# Patient Record
Sex: Male | Born: 1945 | Race: White | Hispanic: No | Smoking: Former smoker
Health system: Southern US, Community
[De-identification: ages and names within clinical notes are randomized; demographics above are authoritative.]

## PROBLEM LIST (undated history)

## (undated) DIAGNOSIS — I700 Atherosclerosis of aorta: Secondary | ICD-10-CM

## (undated) DIAGNOSIS — E119 Type 2 diabetes mellitus without complications: Secondary | ICD-10-CM

## (undated) DIAGNOSIS — Z8679 Personal history of other diseases of the circulatory system: Secondary | ICD-10-CM

## (undated) DIAGNOSIS — R51 Headache: Secondary | ICD-10-CM

## (undated) DIAGNOSIS — K429 Umbilical hernia without obstruction or gangrene: Secondary | ICD-10-CM

## (undated) DIAGNOSIS — R519 Headache, unspecified: Secondary | ICD-10-CM

## (undated) DIAGNOSIS — M179 Osteoarthritis of knee, unspecified: Secondary | ICD-10-CM

## (undated) DIAGNOSIS — E785 Hyperlipidemia, unspecified: Secondary | ICD-10-CM

## (undated) DIAGNOSIS — Z860100 Personal history of colon polyps, unspecified: Secondary | ICD-10-CM

## (undated) DIAGNOSIS — I713 Abdominal aortic aneurysm, ruptured, unspecified: Secondary | ICD-10-CM

## (undated) DIAGNOSIS — K5790 Diverticulosis of intestine, part unspecified, without perforation or abscess without bleeding: Secondary | ICD-10-CM

## (undated) DIAGNOSIS — M25511 Pain in right shoulder: Secondary | ICD-10-CM

## (undated) DIAGNOSIS — I10 Essential (primary) hypertension: Secondary | ICD-10-CM

## (undated) DIAGNOSIS — M1990 Unspecified osteoarthritis, unspecified site: Secondary | ICD-10-CM

## (undated) DIAGNOSIS — N433 Hydrocele, unspecified: Secondary | ICD-10-CM

## (undated) DIAGNOSIS — J449 Chronic obstructive pulmonary disease, unspecified: Secondary | ICD-10-CM

## (undated) HISTORY — PX: TONSILLECTOMY: SUR1361

## (undated) HISTORY — DX: Osteoarthritis of knee, unspecified: M17.9

## (undated) HISTORY — DX: Essential (primary) hypertension: I10

## (undated) HISTORY — PX: EYE SURGERY: SHX253

## (undated) HISTORY — PX: FINGER SURGERY: SHX640

## (undated) HISTORY — PX: ABDOMINAL AORTIC ANEURYSM REPAIR: SUR1152

## (undated) HISTORY — DX: Hyperlipidemia, unspecified: E78.5

---

## 1993-12-11 HISTORY — PX: UPPER GI ENDOSCOPY: SHX6162

## 2010-04-07 ENCOUNTER — Emergency Department (HOSPITAL_COMMUNITY): Admission: EM | Admit: 2010-04-07 | Discharge: 2010-04-07 | Payer: Self-pay | Admitting: Emergency Medicine

## 2010-05-12 ENCOUNTER — Emergency Department (HOSPITAL_COMMUNITY): Admission: EM | Admit: 2010-05-12 | Discharge: 2010-05-13 | Payer: Self-pay | Admitting: Emergency Medicine

## 2011-01-01 DIAGNOSIS — Z8679 Personal history of other diseases of the circulatory system: Secondary | ICD-10-CM

## 2011-01-01 HISTORY — DX: Personal history of other diseases of the circulatory system: Z86.79

## 2011-02-27 LAB — CBC
Platelets: 128 10*3/uL — ABNORMAL LOW (ref 150–400)
WBC: 9.2 10*3/uL (ref 4.0–10.5)

## 2011-02-27 LAB — POCT I-STAT, CHEM 8
BUN: 13 mg/dL (ref 6–23)
Creatinine, Ser: 0.8 mg/dL (ref 0.4–1.5)
Glucose, Bld: 121 mg/dL — ABNORMAL HIGH (ref 70–99)
HCT: 42 % (ref 39.0–52.0)
Potassium: 3.9 mEq/L (ref 3.5–5.1)
Sodium: 139 mEq/L (ref 135–145)
TCO2: 29 mmol/L (ref 0–100)

## 2011-02-27 LAB — POCT CARDIAC MARKERS
CKMB, poc: <1 ng/mL — ABNORMAL LOW (ref 1.0–8.0)
Troponin i, poc: <0.05 ng/mL (ref 0.00–0.09)

## 2011-02-27 LAB — D-DIMER, QUANTITATIVE: D-Dimer, Quant: 2.25 ug/mL-FEU — ABNORMAL HIGH (ref 0.00–0.48)

## 2011-02-28 LAB — COMPREHENSIVE METABOLIC PANEL
ALT: 10 U/L (ref 0–53)
AST: 20 U/L (ref 0–37)
Albumin: 4.1 g/dL (ref 3.5–5.2)
Alkaline Phosphatase: 68 U/L (ref 39–117)
BUN: 10 mg/dL (ref 6–23)
CO2: 24 mEq/L (ref 19–32)
Calcium: 8.8 mg/dL (ref 8.4–10.5)
Chloride: 102 mEq/L (ref 96–112)
Creatinine, Ser: 1.01 mg/dL (ref 0.4–1.5)
GFR calc Af Amer: >60 mL/min (ref >60)
GFR calc non Af Amer: >60 mL/min (ref >60)
Glucose, Bld: 166 mg/dL — ABNORMAL HIGH (ref 70–99)
Potassium: 3.2 mEq/L — ABNORMAL LOW (ref 3.5–5.1)
Sodium: 138 mEq/L (ref 135–145)
Total Bilirubin: 0.9 mg/dL (ref 0.3–1.2)
Total Protein: 6.6 g/dL (ref 6.0–8.3)

## 2011-02-28 LAB — POCT I-STAT, CHEM 8
BUN: 10 mg/dL (ref 6–23)
Calcium, Ion: 1.02 mmol/L — ABNORMAL LOW (ref 1.12–1.32)
Creatinine, Ser: 0.9 mg/dL (ref 0.4–1.5)
HCT: 39 % (ref 39.0–52.0)
Hemoglobin: 13.3 g/dL (ref 13.0–17.0)
Sodium: 139 mEq/L (ref 135–145)

## 2011-02-28 LAB — TYPE AND SCREEN

## 2011-02-28 LAB — CBC
Hemoglobin: 12.3 g/dL — ABNORMAL LOW (ref 13.0–17.0)
MCV: 94.8 fL (ref 78.0–100.0)
Platelets: 214 10*3/uL (ref 150–400)
RDW: 14.5 % (ref 11.5–15.5)
WBC: 20.2 10*3/uL — ABNORMAL HIGH (ref 4.0–10.5)

## 2011-02-28 LAB — POCT CARDIAC MARKERS: Troponin i, poc: <0.05 ng/mL (ref 0.00–0.09)

## 2011-02-28 LAB — DIFFERENTIAL
Eosinophils Absolute: 0 10*3/uL (ref 0.0–0.7)
Lymphocytes Relative: 8 % — ABNORMAL LOW (ref 12–46)
Lymphs Abs: 1.5 10*3/uL (ref 0.7–4.0)
Neutro Abs: 17.9 10*3/uL — ABNORMAL HIGH (ref 1.7–7.7)
Neutrophils Relative %: 89 % — ABNORMAL HIGH (ref 43–77)

## 2011-02-28 LAB — PROTIME-INR: Prothrombin Time: 13.7 seconds (ref 11.6–15.2)

## 2011-02-28 LAB — LIPASE, BLOOD: Lipase: 30 U/L (ref 11–59)

## 2012-01-30 DIAGNOSIS — D485 Neoplasm of uncertain behavior of skin: Secondary | ICD-10-CM | POA: Diagnosis not present

## 2012-01-30 DIAGNOSIS — L708 Other acne: Secondary | ICD-10-CM | POA: Diagnosis not present

## 2012-04-05 IMAGING — CT CT ABD-PELV W/O CM
2 of 4 series · 17 of 46 positions shown, 19 images · non-contrast
Comparison: None.

CLINICAL DATA: Severe left flank pain.  Back pain.

CT ABDOMEN AND PELVIS WITHOUT CONTRAST
TECHNIQUE: Multidetector CT imaging of the abdomen and pelvis was
performed following the standard protocol without intravenous
contrast.

[Series 2: abd/pelv w/o 5.0 b31f st · axial · non-contrast · 0.79mm/px · z∈[-680,-230]mm · 14 of 100 slices shown, 16 images]
[im 5/100  soft-tissue]
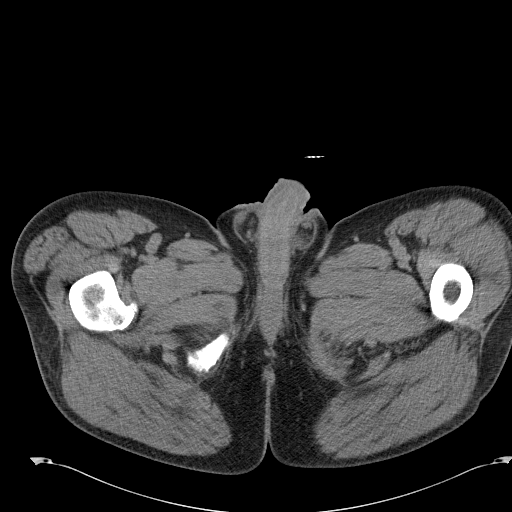
[im 5/100  bone]
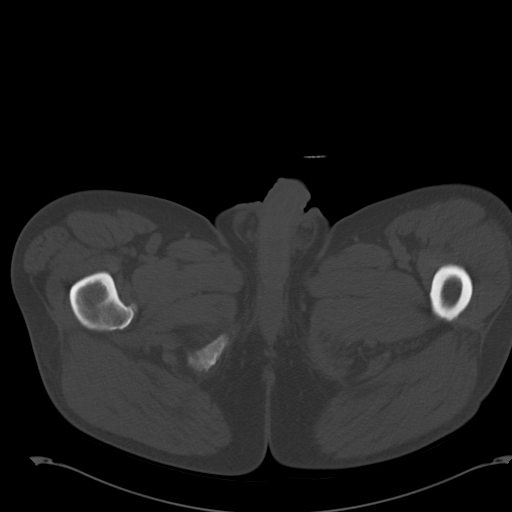
[im 13/100  soft-tissue]
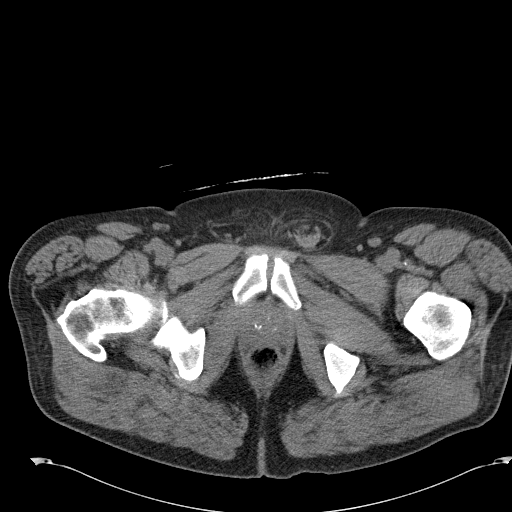
[im 21/100  soft-tissue]
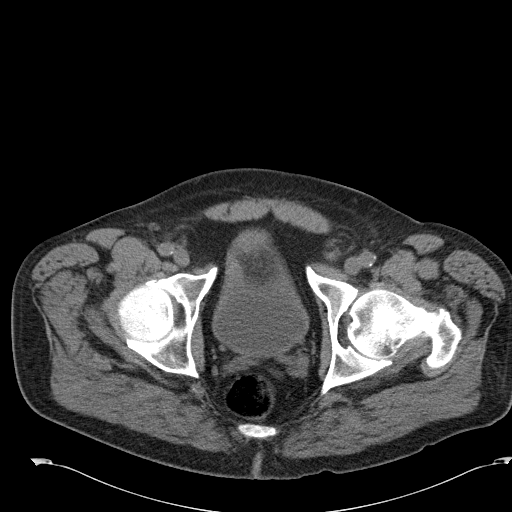
[im 25/100  soft-tissue]
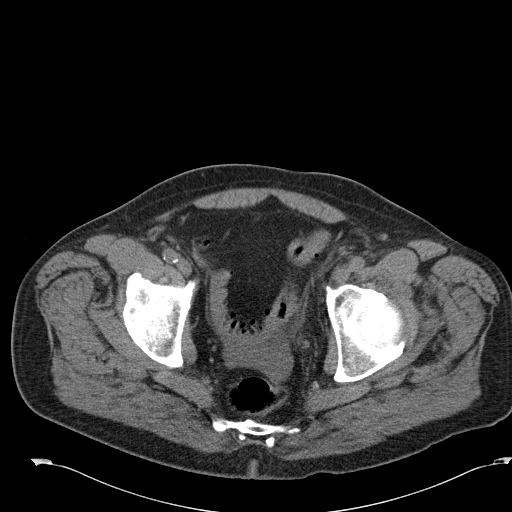
[im 34/100  soft-tissue]
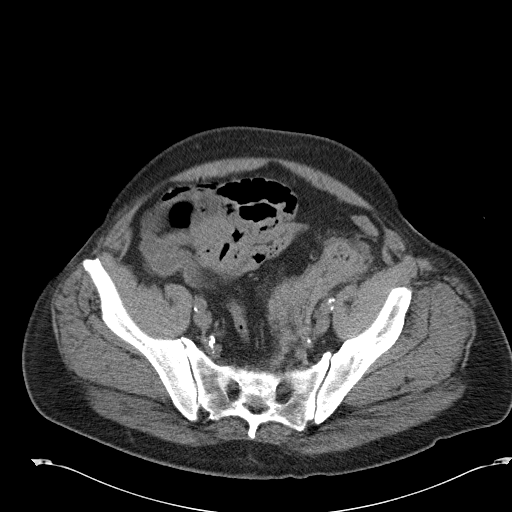
[im 42/100  soft-tissue]
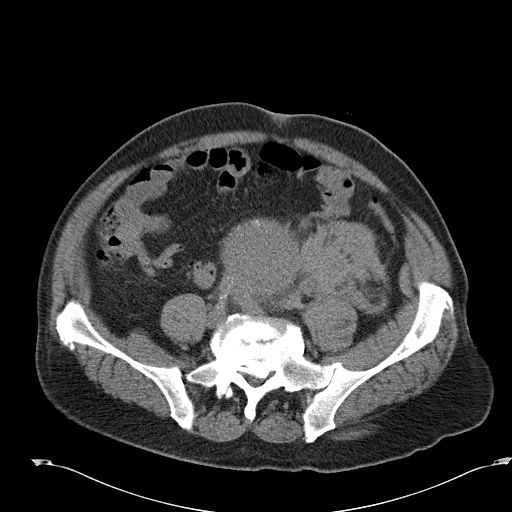
[im 46/100  soft-tissue]
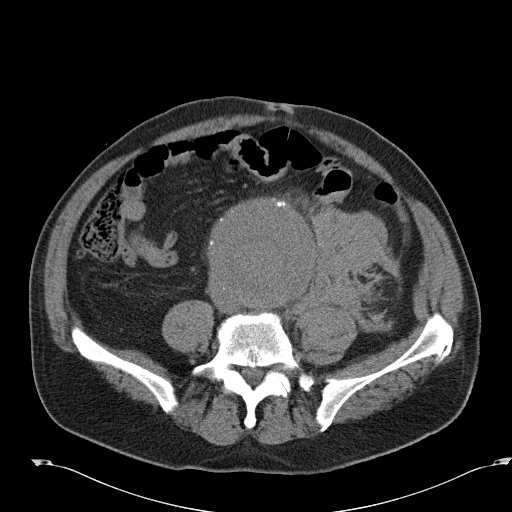
[im 54/100  soft-tissue]
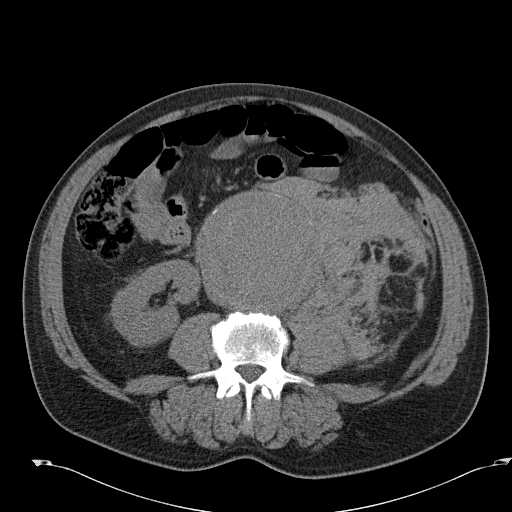
[im 58/100  soft-tissue]
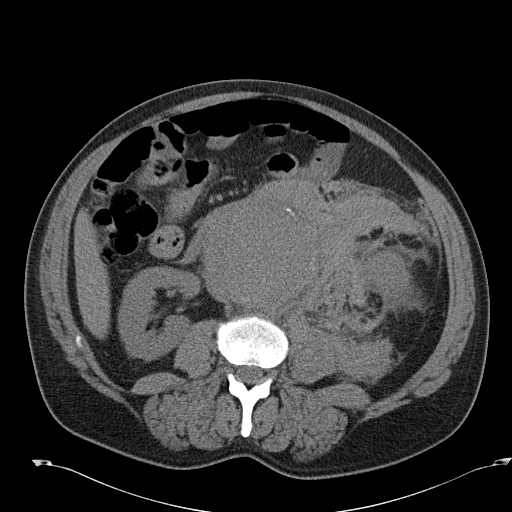
[im 58/100  bone]
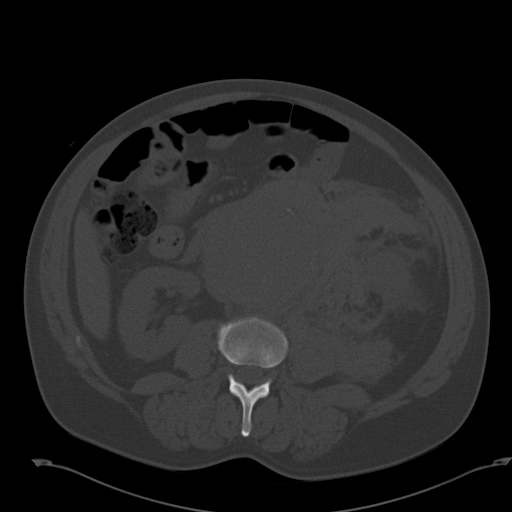
[im 67/100  soft-tissue]
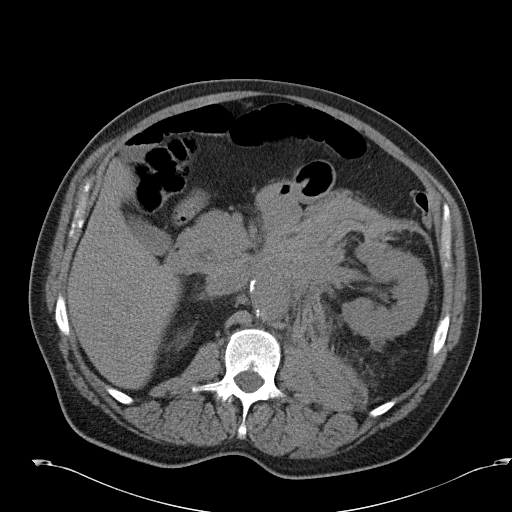
[im 75/100  soft-tissue]
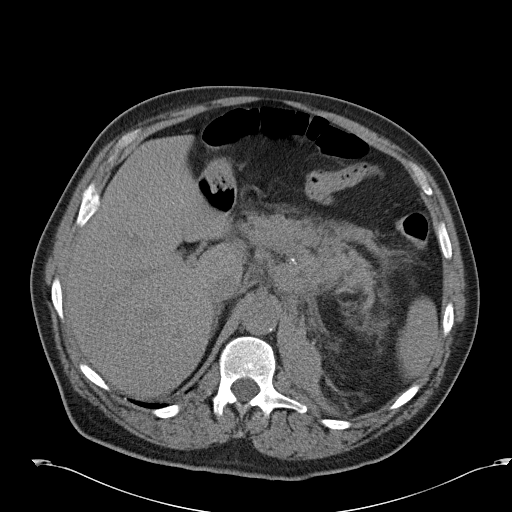
[im 79/100  soft-tissue]
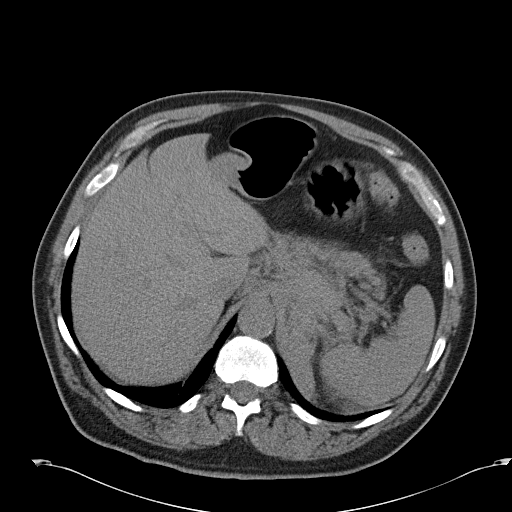
[im 87/100  soft-tissue]
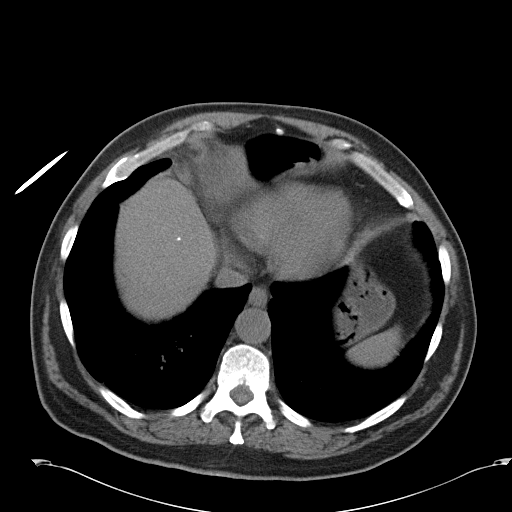
[im 95/100  soft-tissue]
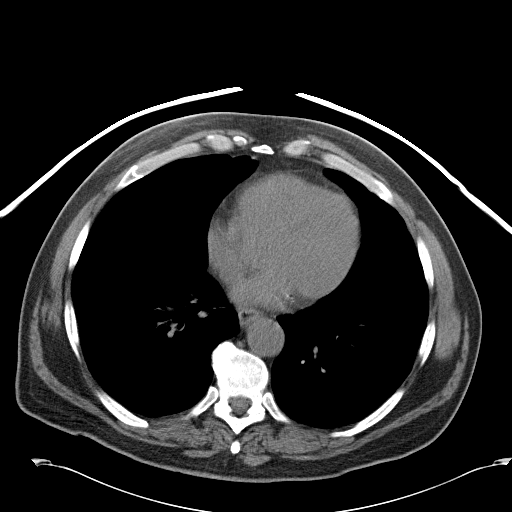

[Series 602: coronal · coronal · 0.97mm/px · 3 of 104 slices shown]
[im 35/104  soft-tissue]
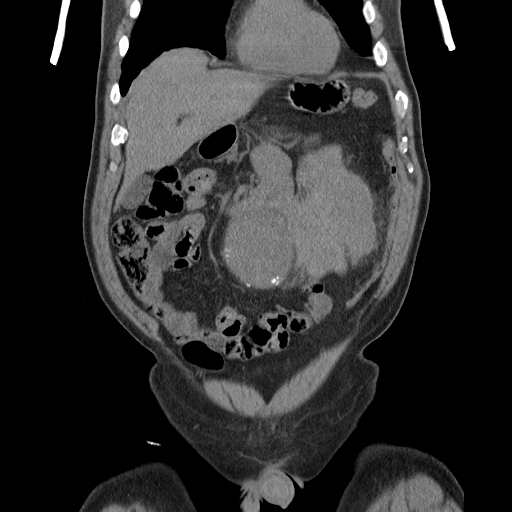
[im 46/104  soft-tissue]
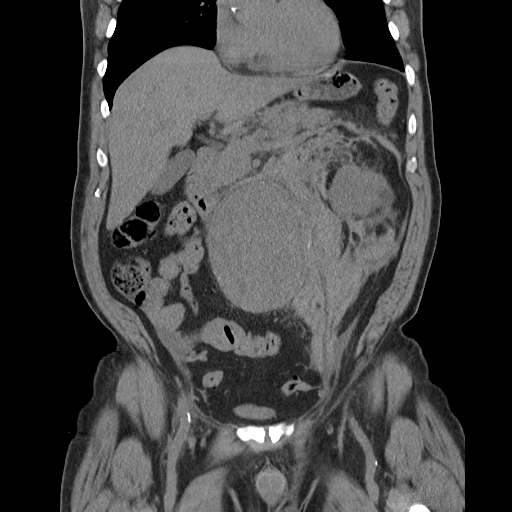
[im 58/104  soft-tissue]
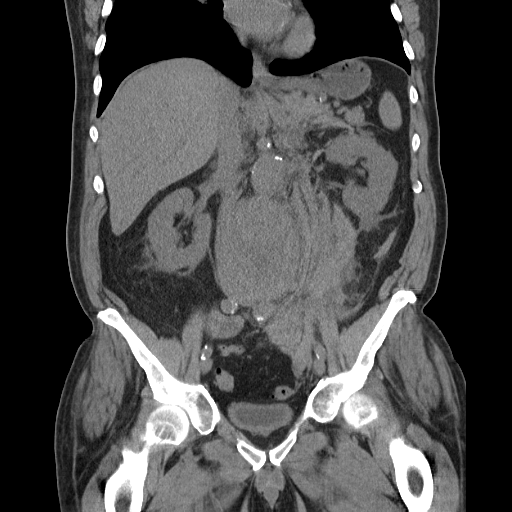

[17 of 46 positions shown; findings below may reference images not displayed]

FINDINGS: There is a large (10 cm diameter) infrarenal abdominal
aortic aneurysm which has ruptured with extensive bleeding into the
left retroperitoneal space.  Hemorrhage extends down into the
pelvis. Aneurysm extends down to the iliac arteries without direct
involvement of the iliacs. The iliacs are tortuous and ectatic.
The proximal abdominal aorta measures up to approximately 3 cm.

The aneurysm neck is approximately 3.6 cm in length. Displacement
of the left kidney anterolaterally.

No hydronephrosis.  Incidental left renal cyst.

No acute abnormality involving the liver, spleen, or pancreas.
Small amount of free intraperitoneal blood in the pelvis.  Bladder
intact.  Prostate gland slightly enlarged.  Seminal vesicles
normal.  Spondylosis of the lumbar spine.  Advanced degenerative
disc disease at L5-S1.
IMPRESSION: Large ruptured AAA.  Extensive retroperitoneal hemorrhage.  See
above comments.

## 2012-04-29 ENCOUNTER — Encounter: Payer: Self-pay | Admitting: Internal Medicine

## 2012-06-26 DIAGNOSIS — H0230 Blepharochalasis unspecified eye, unspecified eyelid: Secondary | ICD-10-CM | POA: Diagnosis not present

## 2012-06-26 DIAGNOSIS — Z961 Presence of intraocular lens: Secondary | ICD-10-CM | POA: Diagnosis not present

## 2012-06-26 DIAGNOSIS — H40019 Open angle with borderline findings, low risk, unspecified eye: Secondary | ICD-10-CM | POA: Diagnosis not present

## 2012-06-26 DIAGNOSIS — H2510 Age-related nuclear cataract, unspecified eye: Secondary | ICD-10-CM | POA: Diagnosis not present

## 2012-07-01 DIAGNOSIS — I713 Abdominal aortic aneurysm, ruptured, unspecified: Secondary | ICD-10-CM | POA: Insufficient documentation

## 2012-07-01 DIAGNOSIS — E7800 Pure hypercholesterolemia, unspecified: Secondary | ICD-10-CM | POA: Diagnosis not present

## 2012-07-01 DIAGNOSIS — Z09 Encounter for follow-up examination after completed treatment for conditions other than malignant neoplasm: Secondary | ICD-10-CM | POA: Diagnosis not present

## 2012-07-01 DIAGNOSIS — K439 Ventral hernia without obstruction or gangrene: Secondary | ICD-10-CM | POA: Diagnosis not present

## 2012-07-01 DIAGNOSIS — I10 Essential (primary) hypertension: Secondary | ICD-10-CM | POA: Insufficient documentation

## 2012-07-01 DIAGNOSIS — I714 Abdominal aortic aneurysm, without rupture: Secondary | ICD-10-CM | POA: Diagnosis not present

## 2012-07-01 DIAGNOSIS — K432 Incisional hernia without obstruction or gangrene: Secondary | ICD-10-CM | POA: Insufficient documentation

## 2012-10-02 DIAGNOSIS — E785 Hyperlipidemia, unspecified: Secondary | ICD-10-CM | POA: Diagnosis not present

## 2012-10-02 DIAGNOSIS — I10 Essential (primary) hypertension: Secondary | ICD-10-CM | POA: Diagnosis not present

## 2012-10-02 DIAGNOSIS — Z125 Encounter for screening for malignant neoplasm of prostate: Secondary | ICD-10-CM | POA: Diagnosis not present

## 2012-10-08 DIAGNOSIS — I10 Essential (primary) hypertension: Secondary | ICD-10-CM | POA: Diagnosis not present

## 2012-10-08 DIAGNOSIS — Z23 Encounter for immunization: Secondary | ICD-10-CM | POA: Diagnosis not present

## 2012-10-08 DIAGNOSIS — I714 Abdominal aortic aneurysm, without rupture: Secondary | ICD-10-CM | POA: Diagnosis not present

## 2012-10-08 DIAGNOSIS — Z125 Encounter for screening for malignant neoplasm of prostate: Secondary | ICD-10-CM | POA: Diagnosis not present

## 2012-10-08 DIAGNOSIS — Z0000 Encounter for general adult medical examination without abnormal findings: Secondary | ICD-10-CM | POA: Diagnosis not present

## 2012-10-11 DIAGNOSIS — Z1212 Encounter for screening for malignant neoplasm of rectum: Secondary | ICD-10-CM | POA: Diagnosis not present

## 2013-03-20 ENCOUNTER — Encounter: Payer: Self-pay | Admitting: Internal Medicine

## 2013-04-07 DIAGNOSIS — I714 Abdominal aortic aneurysm, without rupture: Secondary | ICD-10-CM | POA: Diagnosis not present

## 2013-04-07 DIAGNOSIS — E785 Hyperlipidemia, unspecified: Secondary | ICD-10-CM | POA: Diagnosis not present

## 2013-04-07 DIAGNOSIS — Z1331 Encounter for screening for depression: Secondary | ICD-10-CM | POA: Diagnosis not present

## 2013-04-07 DIAGNOSIS — K279 Peptic ulcer, site unspecified, unspecified as acute or chronic, without hemorrhage or perforation: Secondary | ICD-10-CM | POA: Diagnosis not present

## 2013-04-07 DIAGNOSIS — K432 Incisional hernia without obstruction or gangrene: Secondary | ICD-10-CM | POA: Diagnosis not present

## 2013-04-07 DIAGNOSIS — I10 Essential (primary) hypertension: Secondary | ICD-10-CM | POA: Diagnosis not present

## 2013-10-06 DIAGNOSIS — M1710 Unilateral primary osteoarthritis, unspecified knee: Secondary | ICD-10-CM | POA: Diagnosis not present

## 2013-10-06 DIAGNOSIS — I10 Essential (primary) hypertension: Secondary | ICD-10-CM | POA: Diagnosis not present

## 2013-10-06 DIAGNOSIS — Z6831 Body mass index (BMI) 31.0-31.9, adult: Secondary | ICD-10-CM | POA: Diagnosis not present

## 2013-10-31 DIAGNOSIS — E785 Hyperlipidemia, unspecified: Secondary | ICD-10-CM | POA: Diagnosis not present

## 2013-10-31 DIAGNOSIS — R809 Proteinuria, unspecified: Secondary | ICD-10-CM | POA: Diagnosis not present

## 2013-10-31 DIAGNOSIS — R7309 Other abnormal glucose: Secondary | ICD-10-CM | POA: Diagnosis not present

## 2013-10-31 DIAGNOSIS — I10 Essential (primary) hypertension: Secondary | ICD-10-CM | POA: Diagnosis not present

## 2013-10-31 DIAGNOSIS — Z125 Encounter for screening for malignant neoplasm of prostate: Secondary | ICD-10-CM | POA: Diagnosis not present

## 2013-11-11 DIAGNOSIS — I714 Abdominal aortic aneurysm, without rupture: Secondary | ICD-10-CM | POA: Diagnosis not present

## 2013-11-11 DIAGNOSIS — M1710 Unilateral primary osteoarthritis, unspecified knee: Secondary | ICD-10-CM | POA: Diagnosis not present

## 2013-11-11 DIAGNOSIS — Z23 Encounter for immunization: Secondary | ICD-10-CM | POA: Diagnosis not present

## 2013-11-11 DIAGNOSIS — R7309 Other abnormal glucose: Secondary | ICD-10-CM | POA: Diagnosis not present

## 2013-11-11 DIAGNOSIS — Z0000 Encounter for general adult medical examination without abnormal findings: Secondary | ICD-10-CM | POA: Diagnosis not present

## 2013-11-11 DIAGNOSIS — J449 Chronic obstructive pulmonary disease, unspecified: Secondary | ICD-10-CM | POA: Diagnosis not present

## 2013-11-11 DIAGNOSIS — K432 Incisional hernia without obstruction or gangrene: Secondary | ICD-10-CM | POA: Diagnosis not present

## 2013-11-11 DIAGNOSIS — K279 Peptic ulcer, site unspecified, unspecified as acute or chronic, without hemorrhage or perforation: Secondary | ICD-10-CM | POA: Diagnosis not present

## 2013-11-11 DIAGNOSIS — Z125 Encounter for screening for malignant neoplasm of prostate: Secondary | ICD-10-CM | POA: Diagnosis not present

## 2013-11-11 DIAGNOSIS — E785 Hyperlipidemia, unspecified: Secondary | ICD-10-CM | POA: Diagnosis not present

## 2013-11-18 DIAGNOSIS — Z1212 Encounter for screening for malignant neoplasm of rectum: Secondary | ICD-10-CM | POA: Diagnosis not present

## 2014-05-12 DIAGNOSIS — J309 Allergic rhinitis, unspecified: Secondary | ICD-10-CM | POA: Diagnosis not present

## 2014-05-12 DIAGNOSIS — I10 Essential (primary) hypertension: Secondary | ICD-10-CM | POA: Diagnosis not present

## 2014-05-12 DIAGNOSIS — R7309 Other abnormal glucose: Secondary | ICD-10-CM | POA: Diagnosis not present

## 2014-05-12 DIAGNOSIS — Z6831 Body mass index (BMI) 31.0-31.9, adult: Secondary | ICD-10-CM | POA: Diagnosis not present

## 2014-05-12 DIAGNOSIS — J449 Chronic obstructive pulmonary disease, unspecified: Secondary | ICD-10-CM | POA: Diagnosis not present

## 2014-05-12 DIAGNOSIS — I714 Abdominal aortic aneurysm, without rupture, unspecified: Secondary | ICD-10-CM | POA: Diagnosis not present

## 2014-11-17 DIAGNOSIS — E119 Type 2 diabetes mellitus without complications: Secondary | ICD-10-CM | POA: Diagnosis not present

## 2014-11-17 DIAGNOSIS — N390 Urinary tract infection, site not specified: Secondary | ICD-10-CM | POA: Diagnosis not present

## 2014-11-17 DIAGNOSIS — Z125 Encounter for screening for malignant neoplasm of prostate: Secondary | ICD-10-CM | POA: Diagnosis not present

## 2014-11-17 DIAGNOSIS — Z0000 Encounter for general adult medical examination without abnormal findings: Secondary | ICD-10-CM | POA: Diagnosis not present

## 2014-11-17 DIAGNOSIS — E785 Hyperlipidemia, unspecified: Secondary | ICD-10-CM | POA: Diagnosis not present

## 2014-11-24 DIAGNOSIS — I714 Abdominal aortic aneurysm, without rupture: Secondary | ICD-10-CM | POA: Diagnosis not present

## 2014-11-24 DIAGNOSIS — N390 Urinary tract infection, site not specified: Secondary | ICD-10-CM | POA: Diagnosis not present

## 2014-11-24 DIAGNOSIS — J449 Chronic obstructive pulmonary disease, unspecified: Secondary | ICD-10-CM | POA: Diagnosis not present

## 2014-11-24 DIAGNOSIS — K279 Peptic ulcer, site unspecified, unspecified as acute or chronic, without hemorrhage or perforation: Secondary | ICD-10-CM | POA: Diagnosis not present

## 2014-11-24 DIAGNOSIS — I10 Essential (primary) hypertension: Secondary | ICD-10-CM | POA: Diagnosis not present

## 2014-11-24 DIAGNOSIS — Z008 Encounter for other general examination: Secondary | ICD-10-CM | POA: Diagnosis not present

## 2014-11-24 DIAGNOSIS — K432 Incisional hernia without obstruction or gangrene: Secondary | ICD-10-CM | POA: Diagnosis not present

## 2014-11-24 DIAGNOSIS — E785 Hyperlipidemia, unspecified: Secondary | ICD-10-CM | POA: Diagnosis not present

## 2014-11-24 DIAGNOSIS — Z1389 Encounter for screening for other disorder: Secondary | ICD-10-CM | POA: Diagnosis not present

## 2015-03-02 DIAGNOSIS — E119 Type 2 diabetes mellitus without complications: Secondary | ICD-10-CM | POA: Diagnosis not present

## 2015-03-02 DIAGNOSIS — I714 Abdominal aortic aneurysm, without rupture: Secondary | ICD-10-CM | POA: Diagnosis not present

## 2015-03-02 DIAGNOSIS — M179 Osteoarthritis of knee, unspecified: Secondary | ICD-10-CM | POA: Diagnosis not present

## 2015-03-02 DIAGNOSIS — E785 Hyperlipidemia, unspecified: Secondary | ICD-10-CM | POA: Diagnosis not present

## 2015-03-02 DIAGNOSIS — L989 Disorder of the skin and subcutaneous tissue, unspecified: Secondary | ICD-10-CM | POA: Diagnosis not present

## 2015-03-02 DIAGNOSIS — Z6831 Body mass index (BMI) 31.0-31.9, adult: Secondary | ICD-10-CM | POA: Diagnosis not present

## 2015-03-02 DIAGNOSIS — B078 Other viral warts: Secondary | ICD-10-CM | POA: Diagnosis not present

## 2015-03-02 DIAGNOSIS — I10 Essential (primary) hypertension: Secondary | ICD-10-CM | POA: Diagnosis not present

## 2015-07-16 DIAGNOSIS — Z1389 Encounter for screening for other disorder: Secondary | ICD-10-CM | POA: Diagnosis not present

## 2015-07-16 DIAGNOSIS — Z6831 Body mass index (BMI) 31.0-31.9, adult: Secondary | ICD-10-CM | POA: Diagnosis not present

## 2015-07-16 DIAGNOSIS — E119 Type 2 diabetes mellitus without complications: Secondary | ICD-10-CM | POA: Diagnosis not present

## 2015-07-16 DIAGNOSIS — E785 Hyperlipidemia, unspecified: Secondary | ICD-10-CM | POA: Diagnosis not present

## 2015-07-16 DIAGNOSIS — I714 Abdominal aortic aneurysm, without rupture: Secondary | ICD-10-CM | POA: Diagnosis not present

## 2015-07-16 DIAGNOSIS — I10 Essential (primary) hypertension: Secondary | ICD-10-CM | POA: Diagnosis not present

## 2015-10-26 DIAGNOSIS — L309 Dermatitis, unspecified: Secondary | ICD-10-CM | POA: Diagnosis not present

## 2015-10-26 DIAGNOSIS — Z6831 Body mass index (BMI) 31.0-31.9, adult: Secondary | ICD-10-CM | POA: Diagnosis not present

## 2015-11-25 DIAGNOSIS — R8299 Other abnormal findings in urine: Secondary | ICD-10-CM | POA: Diagnosis not present

## 2015-11-25 DIAGNOSIS — Z125 Encounter for screening for malignant neoplasm of prostate: Secondary | ICD-10-CM | POA: Diagnosis not present

## 2015-11-25 DIAGNOSIS — I10 Essential (primary) hypertension: Secondary | ICD-10-CM | POA: Diagnosis not present

## 2015-11-25 DIAGNOSIS — N390 Urinary tract infection, site not specified: Secondary | ICD-10-CM | POA: Diagnosis not present

## 2015-11-25 DIAGNOSIS — E784 Other hyperlipidemia: Secondary | ICD-10-CM | POA: Diagnosis not present

## 2015-11-25 DIAGNOSIS — E119 Type 2 diabetes mellitus without complications: Secondary | ICD-10-CM | POA: Diagnosis not present

## 2015-11-29 DIAGNOSIS — K432 Incisional hernia without obstruction or gangrene: Secondary | ICD-10-CM | POA: Diagnosis not present

## 2015-11-29 DIAGNOSIS — Z0000 Encounter for general adult medical examination without abnormal findings: Secondary | ICD-10-CM | POA: Diagnosis not present

## 2015-11-29 DIAGNOSIS — L309 Dermatitis, unspecified: Secondary | ICD-10-CM | POA: Diagnosis not present

## 2015-11-29 DIAGNOSIS — I714 Abdominal aortic aneurysm, without rupture: Secondary | ICD-10-CM | POA: Diagnosis not present

## 2015-11-29 DIAGNOSIS — Z6830 Body mass index (BMI) 30.0-30.9, adult: Secondary | ICD-10-CM | POA: Diagnosis not present

## 2015-11-29 DIAGNOSIS — Z23 Encounter for immunization: Secondary | ICD-10-CM | POA: Diagnosis not present

## 2015-11-29 DIAGNOSIS — K279 Peptic ulcer, site unspecified, unspecified as acute or chronic, without hemorrhage or perforation: Secondary | ICD-10-CM | POA: Diagnosis not present

## 2015-11-29 DIAGNOSIS — I10 Essential (primary) hypertension: Secondary | ICD-10-CM | POA: Diagnosis not present

## 2015-11-29 DIAGNOSIS — J449 Chronic obstructive pulmonary disease, unspecified: Secondary | ICD-10-CM | POA: Diagnosis not present

## 2015-11-29 DIAGNOSIS — E119 Type 2 diabetes mellitus without complications: Secondary | ICD-10-CM | POA: Diagnosis not present

## 2015-11-29 DIAGNOSIS — M179 Osteoarthritis of knee, unspecified: Secondary | ICD-10-CM | POA: Diagnosis not present

## 2015-11-29 DIAGNOSIS — E784 Other hyperlipidemia: Secondary | ICD-10-CM | POA: Diagnosis not present

## 2016-02-21 DIAGNOSIS — L249 Irritant contact dermatitis, unspecified cause: Secondary | ICD-10-CM | POA: Diagnosis not present

## 2016-04-14 DIAGNOSIS — M170 Bilateral primary osteoarthritis of knee: Secondary | ICD-10-CM | POA: Diagnosis not present

## 2016-06-30 DIAGNOSIS — M170 Bilateral primary osteoarthritis of knee: Secondary | ICD-10-CM | POA: Diagnosis not present

## 2016-07-10 DIAGNOSIS — Z1389 Encounter for screening for other disorder: Secondary | ICD-10-CM | POA: Diagnosis not present

## 2016-07-10 DIAGNOSIS — J449 Chronic obstructive pulmonary disease, unspecified: Secondary | ICD-10-CM | POA: Diagnosis not present

## 2016-07-10 DIAGNOSIS — I10 Essential (primary) hypertension: Secondary | ICD-10-CM | POA: Diagnosis not present

## 2016-07-10 DIAGNOSIS — N509 Disorder of male genital organs, unspecified: Secondary | ICD-10-CM | POA: Diagnosis not present

## 2016-07-10 DIAGNOSIS — Z6830 Body mass index (BMI) 30.0-30.9, adult: Secondary | ICD-10-CM | POA: Diagnosis not present

## 2016-07-10 DIAGNOSIS — I714 Abdominal aortic aneurysm, without rupture: Secondary | ICD-10-CM | POA: Diagnosis not present

## 2016-07-10 DIAGNOSIS — E119 Type 2 diabetes mellitus without complications: Secondary | ICD-10-CM | POA: Diagnosis not present

## 2016-07-10 DIAGNOSIS — E784 Other hyperlipidemia: Secondary | ICD-10-CM | POA: Diagnosis not present

## 2016-07-10 DIAGNOSIS — M179 Osteoarthritis of knee, unspecified: Secondary | ICD-10-CM | POA: Diagnosis not present

## 2016-07-13 DIAGNOSIS — N451 Epididymitis: Secondary | ICD-10-CM | POA: Diagnosis not present

## 2016-07-13 DIAGNOSIS — N50811 Right testicular pain: Secondary | ICD-10-CM | POA: Diagnosis not present

## 2016-08-09 DIAGNOSIS — M1712 Unilateral primary osteoarthritis, left knee: Secondary | ICD-10-CM | POA: Diagnosis not present

## 2016-08-16 DIAGNOSIS — M1712 Unilateral primary osteoarthritis, left knee: Secondary | ICD-10-CM | POA: Diagnosis not present

## 2016-08-18 DIAGNOSIS — H04123 Dry eye syndrome of bilateral lacrimal glands: Secondary | ICD-10-CM | POA: Diagnosis not present

## 2016-08-18 DIAGNOSIS — Z961 Presence of intraocular lens: Secondary | ICD-10-CM | POA: Diagnosis not present

## 2016-08-18 DIAGNOSIS — H02054 Trichiasis without entropian left upper eyelid: Secondary | ICD-10-CM | POA: Diagnosis not present

## 2016-08-18 DIAGNOSIS — H2512 Age-related nuclear cataract, left eye: Secondary | ICD-10-CM | POA: Diagnosis not present

## 2016-08-18 DIAGNOSIS — H0014 Chalazion left upper eyelid: Secondary | ICD-10-CM | POA: Diagnosis not present

## 2016-08-18 DIAGNOSIS — H40013 Open angle with borderline findings, low risk, bilateral: Secondary | ICD-10-CM | POA: Diagnosis not present

## 2016-08-23 DIAGNOSIS — M1712 Unilateral primary osteoarthritis, left knee: Secondary | ICD-10-CM | POA: Diagnosis not present

## 2016-08-29 DIAGNOSIS — Z961 Presence of intraocular lens: Secondary | ICD-10-CM | POA: Diagnosis not present

## 2016-08-29 DIAGNOSIS — H04123 Dry eye syndrome of bilateral lacrimal glands: Secondary | ICD-10-CM | POA: Diagnosis not present

## 2016-08-29 DIAGNOSIS — H02054 Trichiasis without entropian left upper eyelid: Secondary | ICD-10-CM | POA: Diagnosis not present

## 2016-08-29 DIAGNOSIS — H40013 Open angle with borderline findings, low risk, bilateral: Secondary | ICD-10-CM | POA: Diagnosis not present

## 2016-08-29 DIAGNOSIS — H43811 Vitreous degeneration, right eye: Secondary | ICD-10-CM | POA: Diagnosis not present

## 2016-08-29 DIAGNOSIS — H2512 Age-related nuclear cataract, left eye: Secondary | ICD-10-CM | POA: Diagnosis not present

## 2016-08-29 DIAGNOSIS — H0014 Chalazion left upper eyelid: Secondary | ICD-10-CM | POA: Diagnosis not present

## 2016-08-30 DIAGNOSIS — M1712 Unilateral primary osteoarthritis, left knee: Secondary | ICD-10-CM | POA: Diagnosis not present

## 2016-09-06 DIAGNOSIS — M1712 Unilateral primary osteoarthritis, left knee: Secondary | ICD-10-CM | POA: Diagnosis not present

## 2016-09-14 DIAGNOSIS — H43811 Vitreous degeneration, right eye: Secondary | ICD-10-CM | POA: Diagnosis not present

## 2016-09-14 DIAGNOSIS — Z961 Presence of intraocular lens: Secondary | ICD-10-CM | POA: Diagnosis not present

## 2016-09-14 DIAGNOSIS — H0014 Chalazion left upper eyelid: Secondary | ICD-10-CM | POA: Diagnosis not present

## 2016-09-14 DIAGNOSIS — H40013 Open angle with borderline findings, low risk, bilateral: Secondary | ICD-10-CM | POA: Diagnosis not present

## 2016-09-14 DIAGNOSIS — H04123 Dry eye syndrome of bilateral lacrimal glands: Secondary | ICD-10-CM | POA: Diagnosis not present

## 2016-09-14 DIAGNOSIS — H2512 Age-related nuclear cataract, left eye: Secondary | ICD-10-CM | POA: Diagnosis not present

## 2016-09-14 DIAGNOSIS — H02054 Trichiasis without entropian left upper eyelid: Secondary | ICD-10-CM | POA: Diagnosis not present

## 2016-11-22 DIAGNOSIS — D485 Neoplasm of uncertain behavior of skin: Secondary | ICD-10-CM | POA: Diagnosis not present

## 2016-11-22 DIAGNOSIS — L820 Inflamed seborrheic keratosis: Secondary | ICD-10-CM | POA: Diagnosis not present

## 2016-11-30 DIAGNOSIS — E119 Type 2 diabetes mellitus without complications: Secondary | ICD-10-CM | POA: Diagnosis not present

## 2016-11-30 DIAGNOSIS — N390 Urinary tract infection, site not specified: Secondary | ICD-10-CM | POA: Diagnosis not present

## 2016-11-30 DIAGNOSIS — R8299 Other abnormal findings in urine: Secondary | ICD-10-CM | POA: Diagnosis not present

## 2016-11-30 DIAGNOSIS — E784 Other hyperlipidemia: Secondary | ICD-10-CM | POA: Diagnosis not present

## 2016-11-30 DIAGNOSIS — I10 Essential (primary) hypertension: Secondary | ICD-10-CM | POA: Diagnosis not present

## 2016-11-30 DIAGNOSIS — Z125 Encounter for screening for malignant neoplasm of prostate: Secondary | ICD-10-CM | POA: Diagnosis not present

## 2016-12-06 DIAGNOSIS — Z1212 Encounter for screening for malignant neoplasm of rectum: Secondary | ICD-10-CM | POA: Diagnosis not present

## 2016-12-14 DIAGNOSIS — E1151 Type 2 diabetes mellitus with diabetic peripheral angiopathy without gangrene: Secondary | ICD-10-CM | POA: Diagnosis not present

## 2016-12-14 DIAGNOSIS — E784 Other hyperlipidemia: Secondary | ICD-10-CM | POA: Diagnosis not present

## 2016-12-14 DIAGNOSIS — N390 Urinary tract infection, site not specified: Secondary | ICD-10-CM | POA: Diagnosis not present

## 2016-12-14 DIAGNOSIS — J309 Allergic rhinitis, unspecified: Secondary | ICD-10-CM | POA: Diagnosis not present

## 2016-12-14 DIAGNOSIS — Z8679 Personal history of other diseases of the circulatory system: Secondary | ICD-10-CM | POA: Diagnosis not present

## 2016-12-14 DIAGNOSIS — N509 Disorder of male genital organs, unspecified: Secondary | ICD-10-CM | POA: Diagnosis not present

## 2016-12-14 DIAGNOSIS — Z6831 Body mass index (BMI) 31.0-31.9, adult: Secondary | ICD-10-CM | POA: Diagnosis not present

## 2016-12-14 DIAGNOSIS — J449 Chronic obstructive pulmonary disease, unspecified: Secondary | ICD-10-CM | POA: Diagnosis not present

## 2016-12-14 DIAGNOSIS — M179 Osteoarthritis of knee, unspecified: Secondary | ICD-10-CM | POA: Diagnosis not present

## 2016-12-14 DIAGNOSIS — Z1389 Encounter for screening for other disorder: Secondary | ICD-10-CM | POA: Diagnosis not present

## 2016-12-14 DIAGNOSIS — I10 Essential (primary) hypertension: Secondary | ICD-10-CM | POA: Diagnosis not present

## 2016-12-14 DIAGNOSIS — Z0000 Encounter for general adult medical examination without abnormal findings: Secondary | ICD-10-CM | POA: Diagnosis not present

## 2016-12-15 ENCOUNTER — Encounter: Payer: Self-pay | Admitting: Gastroenterology

## 2016-12-21 DIAGNOSIS — Z961 Presence of intraocular lens: Secondary | ICD-10-CM | POA: Diagnosis not present

## 2016-12-21 DIAGNOSIS — H2512 Age-related nuclear cataract, left eye: Secondary | ICD-10-CM | POA: Diagnosis not present

## 2016-12-21 DIAGNOSIS — H02054 Trichiasis without entropian left upper eyelid: Secondary | ICD-10-CM | POA: Diagnosis not present

## 2016-12-21 DIAGNOSIS — H43811 Vitreous degeneration, right eye: Secondary | ICD-10-CM | POA: Diagnosis not present

## 2016-12-21 DIAGNOSIS — H40013 Open angle with borderline findings, low risk, bilateral: Secondary | ICD-10-CM | POA: Diagnosis not present

## 2016-12-21 DIAGNOSIS — H04123 Dry eye syndrome of bilateral lacrimal glands: Secondary | ICD-10-CM | POA: Diagnosis not present

## 2016-12-21 DIAGNOSIS — H0014 Chalazion left upper eyelid: Secondary | ICD-10-CM | POA: Diagnosis not present

## 2017-01-22 ENCOUNTER — Ambulatory Visit (AMBULATORY_SURGERY_CENTER): Payer: Self-pay

## 2017-01-22 VITALS — Ht 71.5 in | Wt 234.0 lb

## 2017-01-22 DIAGNOSIS — Z1211 Encounter for screening for malignant neoplasm of colon: Secondary | ICD-10-CM

## 2017-01-22 MED ORDER — SUPREP BOWEL PREP KIT 17.5-3.13-1.6 GM/177ML PO SOLN: 1.0000 | Freq: Once | ORAL | 0 refills | Status: AC

## 2017-01-22 NOTE — Progress Notes (Signed)
No allergies to eggs or soy No diet meds No home oxygen No past problems with anesthesia  Declined emmi 

## 2017-01-24 DIAGNOSIS — M7542 Impingement syndrome of left shoulder: Secondary | ICD-10-CM | POA: Diagnosis not present

## 2017-02-05 ENCOUNTER — Ambulatory Visit (AMBULATORY_SURGERY_CENTER): Payer: Medicare Other | Admitting: Gastroenterology

## 2017-02-05 ENCOUNTER — Encounter: Payer: Self-pay | Admitting: Gastroenterology

## 2017-02-05 VITALS — BP 113/73 | HR 57 | Temp 98.2°F | Resp 15 | Ht 71.5 in | Wt 234.0 lb

## 2017-02-05 DIAGNOSIS — D125 Benign neoplasm of sigmoid colon: Secondary | ICD-10-CM

## 2017-02-05 DIAGNOSIS — D126 Benign neoplasm of colon, unspecified: Secondary | ICD-10-CM

## 2017-02-05 DIAGNOSIS — Z1211 Encounter for screening for malignant neoplasm of colon: Secondary | ICD-10-CM

## 2017-02-05 DIAGNOSIS — R195 Other fecal abnormalities: Secondary | ICD-10-CM | POA: Diagnosis not present

## 2017-02-05 DIAGNOSIS — D123 Benign neoplasm of transverse colon: Secondary | ICD-10-CM | POA: Diagnosis not present

## 2017-02-05 DIAGNOSIS — K5790 Diverticulosis of intestine, part unspecified, without perforation or abscess without bleeding: Secondary | ICD-10-CM

## 2017-02-05 DIAGNOSIS — Z86010 Personal history of colonic polyps: Secondary | ICD-10-CM | POA: Diagnosis not present

## 2017-02-05 DIAGNOSIS — K635 Polyp of colon: Secondary | ICD-10-CM | POA: Diagnosis not present

## 2017-02-05 DIAGNOSIS — I10 Essential (primary) hypertension: Secondary | ICD-10-CM | POA: Diagnosis not present

## 2017-02-05 HISTORY — DX: Diverticulosis of intestine, part unspecified, without perforation or abscess without bleeding: K57.90

## 2017-02-05 HISTORY — PX: COLONOSCOPY W/ POLYPECTOMY: SHX1380

## 2017-02-05 MED ORDER — SODIUM CHLORIDE 0.9 % IV SOLN: 500.0000 mL | INTRAVENOUS | Status: DC

## 2017-02-05 NOTE — Progress Notes (Signed)
Encouraged pt not to push too hard to pass flatus d/t abd hernia.  Pt and his son states they understand.  maw

## 2017-02-05 NOTE — Patient Instructions (Addendum)
YOU HAD AN ENDOSCOPIC PROCEDURE TODAY AT [facility]:   Refer to the procedure report that was given to you for any specific questions about what was found during the examination.  If the procedure report does not answer your questions, please call your gastroenterologist to clarify.  If you requested that your care partner not be given the details of your procedure findings, then the procedure report has been included in a sealed envelope for you to review at your convenience later.  YOU SHOULD EXPECT: Some feelings of bloating in the abdomen. Passage of more gas than usual.  Walking can help get rid of the air that was put into your GI tract during the procedure and reduce the bloating. If you had a lower endoscopy (such as a colonoscopy or flexible sigmoidoscopy) you may notice spotting of blood in your stool or on the toilet paper. If you underwent a bowel prep for your procedure, you may not have a normal bowel movement for a few days.  Please Note:  You might notice some irritation and congestion in your nose or some drainage.  This is from the oxygen used during your procedure.  There is no need for concern and it should clear up in a day or so.  SYMPTOMS TO REPORT IMMEDIATELY:   Following lower endoscopy (colonoscopy or flexible sigmoidoscopy):  Excessive amounts of blood in the stool  Significant tenderness or worsening of abdominal pains  Swelling of the abdomen that is new, acute  Fever of 100F or higher   For urgent or emergent issues, a gastroenterologist can be reached at any hour by calling [phone].   DIET:  We do recommend a small meal at first, but then you may proceed to your regular diet.  Drink plenty of fluids but you should avoid alcoholic beverages for 24 hours.  ACTIVITY:  You should plan to take it easy for the rest of today and you should NOT DRIVE or use heavy machinery until tomorrow (because of the sedation medicines used during the test).     FOLLOW UP: Our staff will call the number listed on your records the next business day following your procedure to check on you and address any questions or concerns that you may have regarding the information given to you following your procedure. If we do not reach you, we will leave a message.  However, if you are feeling well and you are not experiencing any problems, there is no need to return our call.  We will assume that you have returned to your regular daily activities without incident.  If any biopsies were taken you will be contacted by phone or by letter within the next 1-3 weeks.  Please call us at [phone] if you have not heard about the biopsies in 3 weeks.   Polyps (hnadout given) Diverticulosis (handout given) Hemorrhoids (handout given) High Fiber Diet (handout given) No Ibuprofen, Naproxen, or there Non-Steriodal anti-inflammatory drugs for 2 weeks after polyps removal. You may resume your other current medications today. Please call if any questions or concerns.   SIGNATURES/CONFIDENTIALITY: You and/or your care partner have signed paperwork which will be entered into your electronic medical record.  These signatures attest to the fact that that the information above on your After Visit Summary has been reviewed and is understood.  Full responsibility of the confidentiality of this discharge information lies with you and/or your care-partner.

## 2017-02-05 NOTE — Progress Notes (Signed)
To PACU, vss patent aw report to rn 

## 2017-02-05 NOTE — Progress Notes (Signed)
Pt's states no medical or surgical changes since previsit or office visit.  Pt reported he ate 3 soft scrambled eggs yesterday am 10:00.  Pt reported his last BM was liquid light brown colored.  Rush Barer, RN reported this to Dr. Havery Moros.  No orders given , proceed with colonoscopy. Pt has an abdominal hernia.  maw

## 2017-02-05 NOTE — Op Note (Signed)
[facility] Patient Name: Trevor Bird Procedure Date: 02/05/2017 11:30 AM MRN: [id number] Endoscopist: Remo Lipps P. Cassidie Veiga MD, MD Age: 71 Referring MD:  Date of Birth: 1946/09/13 Gender: Male Account #: [id number] Procedure:                Colonoscopy Indications:              Positive fecal occult blood testing, last                            colonoscopy screening in 2003 Medicines:                Monitored Anesthesia Care Procedure:                Pre-Anesthesia Assessment:                           - Prior to the procedure, a History and Physical                            was performed, and patient medications and                            allergies were reviewed. The patient's tolerance of                            previous anesthesia was also reviewed. The risks                            and benefits of the procedure and the sedation                            options and risks were discussed with the patient.                            All questions were answered, and informed consent                            was obtained. Prior Anticoagulants: The patient has                            taken no previous anticoagulant or antiplatelet                            agents. ASA Grade Assessment: III - A patient with                            severe systemic disease. After reviewing the risks                            and benefits, the patient was deemed in                            satisfactory condition to undergo the procedure.  After obtaining informed consent, the colonoscope                            was passed under direct vision. Throughout the                            procedure, the patient's blood pressure, pulse, and                            oxygen saturations were monitored continuously. The                            Model CF-HQ190L [id number]) scope was introduced                            through the anus and  advanced to the the terminal                            ileum, with identification of the appendiceal                            orifice and IC valve. The colonoscopy was performed                            without difficulty. The patient tolerated the                            procedure well. The quality of the bowel                            preparation was adequate. The terminal ileum,                            ileocecal valve, appendiceal orifice, and rectum                            were photographed. Scope In: 11:41:11 AM Scope Out: 12:08:13 PM Scope Withdrawal Time: 0 hours 22 minutes 49 seconds  Total Procedure Duration: 0 hours 27 minutes 2 seconds  Findings:                 The perianal and digital rectal examinations were                            normal.                           Many medium-mouthed diverticula were found in the                            left colon.                           A 5 mm polyp was found in the splenic flexure. The  polyp was flat. The polyp was removed with a cold                            snare. Resection and retrieval were complete.                           A 4 mm polyp was found in the sigmoid colon. The                            polyp was sessile. The polyp was removed with a                            cold snare. Resection and retrieval were complete.                           The terminal ileum appeared normal.                           Internal hemorrhoids were found during retroflexion.                           The exam was otherwise without abnormality. The                            bowel prep was only fair during intubation, leading                            to several minutes spent lavaging to obtain                            adeviews for screening purposes. Complications:            No immediate complications. Estimated blood loss:                            Minimal. Estimated Blood Loss:      Estimated blood loss was minimal. Impression:               - Diverticulosis in the left colon.                           - One 5 mm polyp at the splenic flexure, removed                            with a cold snare. Resected and retrieved.                           - One 4 mm polyp in the sigmoid colon, removed with                            a cold snare. Resected and retrieved.                           - The examined portion of the ileum was  normal.                           - Internal hemorrhoids.                           - The examination was otherwise normal.                           It's possible hemorrhoids have caused (+) FOBT. Recommendation:           - Patient has a contact number available for                            emergencies. The signs and symptoms of potential                            delayed complications were discussed with the                            patient. Return to normal activities tomorrow.                            Written discharge instructions were provided to the                            patient.                           - Resume previous diet.                           - Continue present medications.                           - No ibuprofen, naproxen, or other non-steroidal                            anti-inflammatory drugs for 2 weeks after polyp                            removal.                           - Await pathology results.                           - Repeat colonoscopy is recommended for                            surveillance. The colonoscopy date will be                            determined after pathology results from today's                            exam become available for review. Remo Lipps P. Kelilah Hebard MD, MD 02/05/2017 12:14:37 PM This  report has been signed electronically.

## 2017-02-05 NOTE — Progress Notes (Signed)
Called to room to assist during endoscopic procedure.  Patient ID and intended procedure confirmed with present staff. Received instructions for my participation in the procedure from the performing physician.  

## 2017-02-05 NOTE — Progress Notes (Signed)
No problems noted in the recovery room. maw 

## 2017-02-06 ENCOUNTER — Telehealth: Payer: Self-pay | Admitting: *Deleted

## 2017-02-06 NOTE — Telephone Encounter (Signed)
  Follow up Call-  Call back number 02/05/2017  Post procedure Call Back phone  # [phone] cell  Permission to leave phone message Yes  Some recent data might be hidden     Patient questions:  Voice mail box has not been set up.

## 2017-02-09 ENCOUNTER — Encounter: Payer: Self-pay | Admitting: Gastroenterology

## 2017-05-24 DIAGNOSIS — I10 Essential (primary) hypertension: Secondary | ICD-10-CM | POA: Diagnosis not present

## 2017-05-24 DIAGNOSIS — J449 Chronic obstructive pulmonary disease, unspecified: Secondary | ICD-10-CM | POA: Diagnosis not present

## 2017-05-24 DIAGNOSIS — E784 Other hyperlipidemia: Secondary | ICD-10-CM | POA: Diagnosis not present

## 2017-05-24 DIAGNOSIS — Z6831 Body mass index (BMI) 31.0-31.9, adult: Secondary | ICD-10-CM | POA: Diagnosis not present

## 2017-05-24 DIAGNOSIS — E1151 Type 2 diabetes mellitus with diabetic peripheral angiopathy without gangrene: Secondary | ICD-10-CM | POA: Diagnosis not present

## 2017-05-24 DIAGNOSIS — M179 Osteoarthritis of knee, unspecified: Secondary | ICD-10-CM | POA: Diagnosis not present

## 2017-05-24 DIAGNOSIS — D126 Benign neoplasm of colon, unspecified: Secondary | ICD-10-CM | POA: Diagnosis not present

## 2017-05-24 DIAGNOSIS — Z8679 Personal history of other diseases of the circulatory system: Secondary | ICD-10-CM | POA: Diagnosis not present

## 2017-05-24 DIAGNOSIS — L308 Other specified dermatitis: Secondary | ICD-10-CM | POA: Diagnosis not present

## 2017-05-29 DIAGNOSIS — M1712 Unilateral primary osteoarthritis, left knee: Secondary | ICD-10-CM | POA: Diagnosis not present

## 2017-05-29 DIAGNOSIS — M1711 Unilateral primary osteoarthritis, right knee: Secondary | ICD-10-CM | POA: Diagnosis not present

## 2017-06-06 DIAGNOSIS — M1712 Unilateral primary osteoarthritis, left knee: Secondary | ICD-10-CM | POA: Diagnosis not present

## 2017-06-06 DIAGNOSIS — M1711 Unilateral primary osteoarthritis, right knee: Secondary | ICD-10-CM | POA: Diagnosis not present

## 2017-06-18 DIAGNOSIS — M1711 Unilateral primary osteoarthritis, right knee: Secondary | ICD-10-CM | POA: Diagnosis not present

## 2017-06-18 DIAGNOSIS — M1712 Unilateral primary osteoarthritis, left knee: Secondary | ICD-10-CM | POA: Diagnosis not present

## 2017-07-30 DIAGNOSIS — M1711 Unilateral primary osteoarthritis, right knee: Secondary | ICD-10-CM | POA: Diagnosis not present

## 2017-07-30 DIAGNOSIS — M1712 Unilateral primary osteoarthritis, left knee: Secondary | ICD-10-CM | POA: Diagnosis not present

## 2017-09-03 DIAGNOSIS — M1712 Unilateral primary osteoarthritis, left knee: Secondary | ICD-10-CM | POA: Diagnosis not present

## 2017-09-03 DIAGNOSIS — M1711 Unilateral primary osteoarthritis, right knee: Secondary | ICD-10-CM | POA: Diagnosis not present

## 2017-10-05 DIAGNOSIS — E7849 Other hyperlipidemia: Secondary | ICD-10-CM | POA: Diagnosis not present

## 2017-10-05 DIAGNOSIS — Z6831 Body mass index (BMI) 31.0-31.9, adult: Secondary | ICD-10-CM | POA: Diagnosis not present

## 2017-10-05 DIAGNOSIS — E1151 Type 2 diabetes mellitus with diabetic peripheral angiopathy without gangrene: Secondary | ICD-10-CM | POA: Diagnosis not present

## 2017-10-05 DIAGNOSIS — I10 Essential (primary) hypertension: Secondary | ICD-10-CM | POA: Diagnosis not present

## 2017-10-05 DIAGNOSIS — Z23 Encounter for immunization: Secondary | ICD-10-CM | POA: Diagnosis not present

## 2017-10-05 DIAGNOSIS — M179 Osteoarthritis of knee, unspecified: Secondary | ICD-10-CM | POA: Diagnosis not present

## 2017-10-05 DIAGNOSIS — Z01818 Encounter for other preprocedural examination: Secondary | ICD-10-CM | POA: Diagnosis not present

## 2017-10-05 DIAGNOSIS — Z8679 Personal history of other diseases of the circulatory system: Secondary | ICD-10-CM | POA: Diagnosis not present

## 2017-10-05 DIAGNOSIS — J449 Chronic obstructive pulmonary disease, unspecified: Secondary | ICD-10-CM | POA: Diagnosis not present

## 2017-10-17 NOTE — Pre-Procedure Instructions (Signed)
JACADEN FORBUSH  10/17/2017      [id number] [place] [place] [address] [place] [address] Phone: [phone] Fax: [phone]    Your procedure is scheduled on November 13  Report to [facility] at Justice.M.  Call this number if you have problems the morning of surgery:  [phone]   Remember:  Do not eat food or drink liquids after midnight.  Continue all other medications as directed by your physician except follow these medication instructions before surgery   Take these medicines the morning of surgery with A SIP OF WATER  amLODipine (NORVASC)  7 days prior to surgery STOP taking any Aspirin (unless otherwise instructed by your surgeon), Aleve, Naproxen, Ibuprofen, Motrin, Advil, Goody's, BC's, all herbal medications, fish oil, and all vitamins    Do not wear jewelry  Do not wear lotions, powders, or cologne, or deoderant.  Men may shave face and neck.  Do not bring valuables to the hospital.  [facility] is not responsible for any belongings or valuables.  Contacts, dentures or bridgework may not be worn into surgery.  Leave your suitcase in the car.  After surgery it may be brought to your room.  For patients admitted to the hospital, discharge time will be determined by your treatment team.  Patients discharged the day of surgery will not be allowed to drive home.    Special instructions:   [facility]- Preparing For Surgery  Before surgery, you can play an important role. Because skin is not sterile, your skin needs to be as free of germs as possible. You can reduce the number of germs on your skin by washing with CHG (chlorahexidine gluconate) Soap before surgery.  CHG is an antiseptic cleaner which kills germs and bonds with the skin to continue killing germs even after washing.  Please do not use if you have an allergy to CHG or antibacterial soaps.  If your skin becomes reddened/irritated stop using the CHG.  Do not shave (including legs and underarms) for at least 48 hours prior to first CHG shower. It is OK to shave your face.  Please follow these instructions carefully.   1. Shower the NIGHT BEFORE SURGERY and the MORNING OF SURGERY with CHG.   2. If you chose to wash your hair, wash your hair first as usual with your normal shampoo.  3. After you shampoo, rinse your hair and body thoroughly to remove the shampoo.  4. Use CHG as you would any other liquid soap. You can apply CHG directly to the skin and wash gently with a scrungie or a clean washcloth.   5. Apply the CHG Soap to your body ONLY FROM THE NECK DOWN.  Do not use on open wounds or open sores. Avoid contact with your eyes, ears, mouth and genitals (private parts). Wash Face and genitals (private parts)  with your normal soap.  6. Wash thoroughly, paying special attention to the area where your surgery will be performed.  7. Thoroughly rinse your body with warm water from the neck down.  8. DO NOT shower/wash with your normal soap after using and rinsing off the CHG Soap.  9. Pat yourself dry with a CLEAN TOWEL.  10. Wear CLEAN PAJAMAS to bed the night before surgery, wear comfortable clothes the morning of surgery  11. Place CLEAN SHEETS on your bed the night of your first shower and  DO NOT SLEEP WITH PETS.    Day of Surgery: Do not apply any deodorants/lotions. Please wear clean clothes to the hospital/surgery center.      Please read over the following fact sheets that you were given.

## 2017-10-17 NOTE — Pre-Procedure Instructions (Signed)
Trevor Bird  10/17/2017      [id number] - [place]  - [place] [address] [place] [address] Phone: [phone] Fax: [phone]    Your procedure is scheduled on Nov. 13  Report to [facility] at 530 A.M.  Call this number if you have problems the morning of surgery:  [phone]   Remember:  Do not eat food or drink liquids after midnight.  Take these medicines the morning of surgery with A SIP OF WATER Amlodipine (norvasc)  Stop taking aspirin, BC's, Goody's, Herbal medications, Fish Oil, Vitamins, Ibuprofen, Advil, motrin, Aleve   Do not wear jewelry, make-up or nail polish.  Do not wear lotions, powders, or perfumes, or deoderant.  Do not shave 48 hours prior to surgery.  Men may shave face and neck.  Do not bring valuables to the hospital.  [facility] is not responsible for any belongings or valuables.  Contacts, dentures or bridgework may not be worn into surgery.  Leave your suitcase in the car.  After surgery it may be brought to your room.  For patients admitted to the hospital, discharge time will be determined by your treatment team.  Patients discharged the day of surgery will not be allowed to drive home.    Special instructions:   - Preparing for Surgery  Before surgery, you can play an important role.  Because skin is not sterile, your skin needs to be as free of germs as possible.  You can reduce the number of germs on you skin by washing with CHG (chlorahexidine gluconate) soap before surgery.  CHG is an antiseptic cleaner which kills germs and bonds with the skin to continue killing germs even after washing.  Please DO NOT use if you have an allergy to CHG or antibacterial soaps.  If your skin becomes reddened/irritated stop using the CHG and inform your nurse when you arrive at Short Stay.  Do not shave (including legs and underarms) for  at least 48 hours prior to the first CHG shower.  You may shave your face.  Please follow these instructions carefully:   1.  Shower with CHG Soap the night before surgery and the  morning of Surgery.  2.  If you choose to wash your hair, wash your hair first as usual with your  normal shampoo.  3.  After you shampoo, rinse your hair and body thoroughly to remove the Shampoo.  4.  Use CHG as you would any other liquid soap.  You can apply chg directly  to the skin and wash gently with scrungie or a clean washcloth.  5.  Apply the CHG Soap to your body ONLY FROM THE NECK DOWN.    Do not use on open wounds or open sores.  Avoid contact with your eyes,       ears, mouth and genitals (private parts).  Wash genitals (private parts) with your normal soap.  6.  Wash thoroughly, paying special attention to the area where your surgery will be performed.  7.  Thoroughly rinse your body with warm water from the neck down.  8.  DO NOT shower/wash with your normal soap after using and rinsing off the CHG Soap.  9.  Pat yourself dry with a clean towel.            10.  Wear clean pajamas.  11.  Place clean sheets on your bed the night of your first shower and do not sleep with pets.  Day of Surgery  Do not apply any lotions/deoderants the morning of surgery.  Please wear clean clothes to the hospital/surgery center.     Please read over the following fact sheets that you were given. Pain Booklet, Coughing and Deep Breathing, MRSA Information and Surgical Site Infection Prevention

## 2017-10-18 ENCOUNTER — Other Ambulatory Visit: Payer: Self-pay

## 2017-10-18 ENCOUNTER — Ambulatory Visit (HOSPITAL_COMMUNITY)
Admission: RE | Admit: 2017-10-18 | Discharge: 2017-10-18 | Disposition: A | Discharge status: Home / Self Care | Payer: Medicare Other | Source: Ambulatory Visit | Attending: Orthopaedic Surgery | Admitting: Orthopaedic Surgery

## 2017-10-18 ENCOUNTER — Other Ambulatory Visit: Payer: Self-pay | Admitting: Orthopaedic Surgery

## 2017-10-18 ENCOUNTER — Encounter (HOSPITAL_COMMUNITY): Payer: Self-pay

## 2017-10-18 ENCOUNTER — Encounter (HOSPITAL_COMMUNITY)
Admission: RE | Admit: 2017-10-18 | Discharge: 2017-10-18 | Disposition: A | Discharge status: Home / Self Care | Payer: Medicare Other | Source: Ambulatory Visit | Attending: Orthopaedic Surgery | Admitting: Orthopaedic Surgery

## 2017-10-18 DIAGNOSIS — J449 Chronic obstructive pulmonary disease, unspecified: Secondary | ICD-10-CM | POA: Diagnosis not present

## 2017-10-18 DIAGNOSIS — M1712 Unilateral primary osteoarthritis, left knee: Secondary | ICD-10-CM | POA: Diagnosis not present

## 2017-10-18 DIAGNOSIS — J439 Emphysema, unspecified: Secondary | ICD-10-CM | POA: Insufficient documentation

## 2017-10-18 DIAGNOSIS — I10 Essential (primary) hypertension: Secondary | ICD-10-CM | POA: Diagnosis not present

## 2017-10-18 DIAGNOSIS — Z01818 Encounter for other preprocedural examination: Secondary | ICD-10-CM | POA: Insufficient documentation

## 2017-10-18 DIAGNOSIS — E669 Obesity, unspecified: Secondary | ICD-10-CM | POA: Insufficient documentation

## 2017-10-18 DIAGNOSIS — Z87891 Personal history of nicotine dependence: Secondary | ICD-10-CM | POA: Insufficient documentation

## 2017-10-18 DIAGNOSIS — Z6832 Body mass index (BMI) 32.0-32.9, adult: Secondary | ICD-10-CM | POA: Insufficient documentation

## 2017-10-18 DIAGNOSIS — Z6831 Body mass index (BMI) 31.0-31.9, adult: Secondary | ICD-10-CM | POA: Diagnosis not present

## 2017-10-18 DIAGNOSIS — R9431 Abnormal electrocardiogram [ECG] [EKG]: Secondary | ICD-10-CM | POA: Insufficient documentation

## 2017-10-18 DIAGNOSIS — Z01810 Encounter for preprocedural cardiovascular examination: Secondary | ICD-10-CM | POA: Diagnosis not present

## 2017-10-18 DIAGNOSIS — I700 Atherosclerosis of aorta: Secondary | ICD-10-CM | POA: Insufficient documentation

## 2017-10-18 DIAGNOSIS — Z01812 Encounter for preprocedural laboratory examination: Secondary | ICD-10-CM | POA: Diagnosis not present

## 2017-10-18 DIAGNOSIS — E119 Type 2 diabetes mellitus without complications: Secondary | ICD-10-CM | POA: Diagnosis not present

## 2017-10-18 DIAGNOSIS — E1151 Type 2 diabetes mellitus with diabetic peripheral angiopathy without gangrene: Secondary | ICD-10-CM | POA: Diagnosis not present

## 2017-10-18 DIAGNOSIS — I739 Peripheral vascular disease, unspecified: Secondary | ICD-10-CM | POA: Insufficient documentation

## 2017-10-18 HISTORY — DX: Headache: R51

## 2017-10-18 HISTORY — DX: Type 2 diabetes mellitus without complications: E11.9

## 2017-10-18 HISTORY — DX: Chronic obstructive pulmonary disease, unspecified: J44.9

## 2017-10-18 HISTORY — DX: Umbilical hernia without obstruction or gangrene: K42.9

## 2017-10-18 HISTORY — DX: Unspecified osteoarthritis, unspecified site: M19.90

## 2017-10-18 HISTORY — DX: Headache, unspecified: R51.9

## 2017-10-18 HISTORY — DX: Atherosclerosis of aorta: I70.0

## 2017-10-18 LAB — CBC WITH DIFFERENTIAL/PLATELET
BASOS ABS: 0 10*3/uL (ref 0.0–0.1)
BASOS PCT: 1 %
EOS ABS: 0.1 10*3/uL (ref 0.0–0.7)
Eosinophils Relative: 1 %
HCT: 44.9 % (ref 39.0–52.0)
HEMOGLOBIN: 14.8 g/dL (ref 13.0–17.0)
Lymphocytes Relative: 30 %
Lymphs Abs: 2 10*3/uL (ref 0.7–4.0)
MCH: 31 pg (ref 26.0–34.0)
MCHC: 33 g/dL (ref 30.0–36.0)
MCV: 93.9 fL (ref 78.0–100.0)
MONO ABS: 0.3 10*3/uL (ref 0.1–1.0)
Monocytes Relative: 5 %
NEUTROS PCT: 63 %
Neutro Abs: 4.1 10*3/uL (ref 1.7–7.7)
Platelets: 191 10*3/uL (ref 150–400)
RBC: 4.78 MIL/uL (ref 4.22–5.81)
RDW: 13.7 % (ref 11.5–15.5)
WBC: 6.5 10*3/uL (ref 4.0–10.5)

## 2017-10-18 LAB — BASIC METABOLIC PANEL
Anion gap: 6 (ref 5–15)
BUN: 11 mg/dL (ref 6–20)
CALCIUM: 9.2 mg/dL (ref 8.9–10.3)
CO2: 29 mmol/L (ref 22–32)
CREATININE: 0.87 mg/dL (ref 0.61–1.24)
Chloride: 103 mmol/L (ref 101–111)
Glucose, Bld: 121 mg/dL — ABNORMAL HIGH (ref 65–99)
Potassium: 4 mmol/L (ref 3.5–5.1)
Sodium: 138 mmol/L (ref 135–145)

## 2017-10-18 LAB — URINALYSIS, ROUTINE W REFLEX MICROSCOPIC
BILIRUBIN URINE: NEGATIVE
Glucose, UA: NEGATIVE mg/dL
Hgb urine dipstick: NEGATIVE
KETONES UR: NEGATIVE mg/dL
Nitrite: NEGATIVE
PROTEIN: NEGATIVE mg/dL
SQUAMOUS EPITHELIAL / LPF: NONE SEEN
Specific Gravity, Urine: 1.014 (ref 1.005–1.030)
pH: 7 (ref 5.0–8.0)

## 2017-10-18 LAB — PROTIME-INR
INR: 1.03
PROTHROMBIN TIME: 13.4 s (ref 11.4–15.2)

## 2017-10-18 LAB — APTT: APTT: 32 s (ref 24–36)

## 2017-10-18 LAB — TYPE AND SCREEN
ABO/RH(D): A POS
Antibody Screen: NEGATIVE

## 2017-10-18 LAB — SURGICAL PCR SCREEN
MRSA, PCR: NEGATIVE
STAPHYLOCOCCUS AUREUS: NEGATIVE

## 2017-10-18 NOTE — Progress Notes (Signed)
PCP - Ravisankar Avva Cardiologist - denies  Chest x-ray - 10/18/17 EKG - 10/18/17 Stress Test - denies ECHO - denies Cardiac Cath - denies   Fasting Blood Sugar - 80-130 Checks Blood Sugar _3____ times a day  Patient is stating that he is not prediabetic but his PCP has him monitoring his diet and blood sugars at home Sending to anesthesia for review of records Called PCP office requesting for last a1c draw date and will request last office visit    Patient denies shortness of breath, fever, cough and chest pain at PAT appointment    Patient verbalized understanding of instructions that were given to them at the PAT appointment. Patient was also instructed that they will need to review over the PAT instructions again at home before surgery.

## 2017-10-19 ENCOUNTER — Encounter (HOSPITAL_COMMUNITY): Payer: Self-pay

## 2017-10-19 NOTE — Progress Notes (Signed)
Anesthesia Chart Review:  Pt is a 71 year old male scheduled for L total knee arthroplasty on 10/23/2017 Melrose Nakayama, MD  - PCP is Prince Solian, MD  PMH includes:  HTN, DM (diet controlled), hyperlipidemia, ruptured AAA (s/p repair 2011 at [facility]), COPD.  Former smoker. BMI 32  Medications include: Amlodipine, Lipitor, losartan  BP (!) 142/84   Pulse 68   Temp 36.6 C   Resp 20   Ht 5' 10.5" (1.791 m)   Wt 227 lb 12.8 oz (103.3 kg)   SpO2 97%   BMI 32.22 kg/m   Preoperative labs reviewed.   - glucose 121.  - HbA1c 7.4 on 10/18/17, improved from 8.3 three weeks ago (from PCP notes)  CXR 10/18/17:  - Chronic emphysematous changes.  No acute pneumonia nor CHF. - Thoracic aortic atherosclerosis.  EKG 10/18/17: NSR. Nonspecific ST and T wave abnormality  If no changes, I anticipate pt can proceed with surgery as scheduled.   Willeen Cass, FNP-BC [facility] Stay Surgical Center/Anesthesiology Phone: [phone] 10/19/2017 2:21 PM

## 2017-10-22 ENCOUNTER — Encounter (HOSPITAL_COMMUNITY): Payer: Self-pay | Admitting: Anesthesiology

## 2017-10-22 MED ORDER — TRANEXAMIC ACID 1000 MG/10ML IV SOLN
2000.0000 mg | INTRAVENOUS | Status: AC
  Administered 2017-10-23: 2000 mg via TOPICAL
  Filled 2017-10-22: qty 20

## 2017-10-22 MED ORDER — TRANEXAMIC ACID 1000 MG/10ML IV SOLN
1000.0000 mg | INTRAVENOUS | Status: AC
  Administered 2017-10-23: 1000 mg via INTRAVENOUS
  Filled 2017-10-22: qty 1100

## 2017-10-22 MED ORDER — CEFAZOLIN SODIUM-DEXTROSE 2-4 GM/100ML-% IV SOLN
2.0000 g | INTRAVENOUS | Status: AC
  Administered 2017-10-23: 2 g via INTRAVENOUS
  Filled 2017-10-22: qty 100

## 2017-10-22 NOTE — Anesthesia Preprocedure Evaluation (Addendum)
Anesthesia Evaluation  Patient identified by MRN, date of birth, ID band Patient awake    Reviewed: Allergy & Precautions, NPO status , Patient's Chart, lab work & pertinent test results  Airway Mallampati: II  TM Distance: >3 FB Neck ROM: Full    Dental  (+) Missing   Pulmonary COPD, former smoker,    Pulmonary exam normal breath sounds clear to auscultation       Cardiovascular hypertension, Pt. on medications + Peripheral Vascular Disease  Normal cardiovascular exam Rhythm:Regular Rate:Normal  Hx/o AAA S/P repair   Neuro/Psych  Headaches, negative psych ROS   GI/Hepatic negative GI ROS, Neg liver ROS,   Endo/Other  diabetes, Well ControlledHyperlipidemia   Renal/GU negative Renal ROS  negative genitourinary   Musculoskeletal  (+) Arthritis , Osteoarthritis,  DJD left knee   Abdominal (+) + obese,   Peds  Hematology negative hematology ROS (+)   Anesthesia Other Findings   Reproductive/Obstetrics                            Anesthesia Physical Anesthesia Plan  ASA: III  Anesthesia Plan: Spinal   Post-op Pain Management:  Regional for Post-op pain   Induction:   PONV Risk Score and Plan: Ondansetron, Treatment may vary due to age or medical condition, Dexamethasone and Propofol infusion  Airway Management Planned: Natural Airway, Nasal Cannula and Simple Face Mask  Additional Equipment:   Intra-op Plan:   Post-operative Plan:   Informed Consent: I have reviewed the patients History and Physical, chart, labs and discussed the procedure including the risks, benefits and alternatives for the proposed anesthesia with the patient or authorized representative who has indicated his/her understanding and acceptance.   Dental advisory given  Plan Discussed with: CRNA, Anesthesiologist and Surgeon  Anesthesia Plan Comments:        Anesthesia Quick Evaluation

## 2017-10-22 NOTE — H&P (Signed)
TOTAL KNEE ADMISSION H&P  Patient is being admitted for left total knee arthroplasty.  Subjective:  Chief Complaint:left knee pain.  HPI: Trevor Bird, 71 y.o. male, has a history of pain and functional disability in the left knee due to arthritis and has failed non-surgical conservative treatments for greater than 12 weeks to includeNSAID's and/or analgesics, corticosteriod injections, viscosupplementation injections, flexibility and strengthening excercises, use of assistive devices, weight reduction as appropriate and activity modification.  Onset of symptoms was gradual, starting 5 years ago with gradually worsening course since that time. The patient noted no past surgery on the left knee(s).  Patient currently rates pain in the left knee(s) at 10 out of 10 with activity. Patient has night pain, worsening of pain with activity and weight bearing, pain that interferes with activities of daily living, crepitus and joint swelling.  Patient has evidence of subchondral cysts, subchondral sclerosis, periarticular osteophytes and joint space narrowing by imaging studies. There is no active infection.  There are no active problems to display for this patient.  Past Medical History:  Diagnosis Date  . Aortic aneurysm ([place])   . Arthritis   . COPD (chronic obstructive pulmonary disease) ([place])    was diagnosed but not anymore since he stopped smoking per patient  . Diabetes mellitus without complication (HCC)    diet controlled  . Headache   . Hyperlipidemia   . Hypertension   . Umbilical hernia     Past Surgical History:  Procedure Laterality Date  . ABDOMINAL AORTIC ANEURYSM REPAIR    . COLONOSCOPY    . EYE SURGERY     right eye has a lense  . TONSILLECTOMY      Current Facility-Administered Medications  Medication Dose Route Frequency Provider Last Rate Last Dose  . 0.9 %  sodium chloride infusion  500 mL Intravenous Continuous Armbruster, Carlota Raspberry, MD       Current Outpatient  Medications  Medication Sig Dispense Refill Last Dose  . amLODipine (NORVASC) 5 MG tablet Take 5 mg by mouth daily.   02/04/2017  . atorvastatin (LIPITOR) 40 MG tablet Take 40 mg by mouth daily.   02/04/2017  . ibuprofen (ADVIL,MOTRIN) 200 MG tablet Take 800 mg every 6 (six) hours as needed by mouth for headache or moderate pain.     Marland Kitchen losartan (COZAAR) 50 MG tablet Take 50 mg by mouth daily.   02/04/2017   No Known Allergies  Social History   Tobacco Use  . Smoking status: Former Smoker    Types: Cigarettes    Last attempt to quit: 10/18/2012    Years since quitting: 5.0  . Smokeless tobacco: Former Systems developer    Quit date: 10/18/2012  Substance Use Topics  . Alcohol use: No    Frequency: Never    Comment: hasnt drank in 3 weeks or so    Family History  Problem Relation Age of Onset  . Colon cancer Neg Hx   . Esophageal cancer Neg Hx   . Pancreatic cancer Neg Hx   . Prostate cancer Neg Hx   . Rectal cancer Neg Hx   . Stomach cancer Neg Hx      Review of Systems  Musculoskeletal: Positive for joint pain.       Left knee  All other systems reviewed and are negative.   Objective:  Physical Exam  Constitutional: He is oriented to person, place, and time. He appears well-developed and well-nourished.  HENT:  Head: Normocephalic and atraumatic.  Eyes: Pupils  are equal, round, and reactive to light.  Neck: Normal range of motion.  Cardiovascular: Normal rate and regular rhythm.  Respiratory: Effort normal.  GI: Soft.  Musculoskeletal:  Left knee motion is about 0-115.  There is no effusion.  He has medial joint line pain and crepitation.  Hip motion is full and pain free and SLR is negative on both sides.  There is no palpable LAD behind either knee.  Sensation and motor function are intact on both sides and there are palpable pulses on both sides.  Neurological: He is alert and oriented to person, place, and time.  Skin: Skin is warm and dry.  Psychiatric: He has a normal mood  and affect. His behavior is normal. Judgment and thought content normal.    Vital signs in last 24 hours:    Labs:   Estimated body mass index is 32.22 kg/m as calculated from the following:   Height as of 10/18/17: 5' 10.5" (1.791 m).   Weight as of 10/18/17: 103.3 kg (227 lb 12.8 oz).   Imaging Review Plain radiographs demonstrate severe degenerative joint disease of the left knee(s). The overall alignment isneutral. The bone quality appears to be good for age and reported activity level.  Assessment/Plan:  End stage primary arthritis, left knee   The patient history, physical examination, clinical judgment of the provider and imaging studies are consistent with end stage degenerative joint disease of the left knee(s) and total knee arthroplasty is deemed medically necessary. The treatment options including medical management, injection therapy arthroscopy and arthroplasty were discussed at length. The risks and benefits of total knee arthroplasty were presented and reviewed. The risks due to aseptic loosening, infection, stiffness, patella tracking problems, thromboembolic complications and other imponderables were discussed. The patient acknowledged the explanation, agreed to proceed with the plan and consent was signed. Patient is being admitted for inpatient treatment for surgery, pain control, PT, OT, prophylactic antibiotics, VTE prophylaxis, progressive ambulation and ADL's and discharge planning. The patient is planning to be discharged home with home health services

## 2017-10-23 ENCOUNTER — Inpatient Hospital Stay (HOSPITAL_COMMUNITY): Payer: Medicare Other | Admitting: Emergency Medicine

## 2017-10-23 ENCOUNTER — Encounter (HOSPITAL_COMMUNITY): Payer: Self-pay | Admitting: Urology

## 2017-10-23 ENCOUNTER — Encounter (HOSPITAL_COMMUNITY)
Admission: RE | Disposition: A | Discharge status: Home Health | Payer: Self-pay | Source: Ambulatory Visit | Attending: Orthopaedic Surgery

## 2017-10-23 ENCOUNTER — Inpatient Hospital Stay (HOSPITAL_COMMUNITY): Payer: Medicare Other | Admitting: Anesthesiology

## 2017-10-23 ENCOUNTER — Inpatient Hospital Stay (HOSPITAL_COMMUNITY)
Admission: RE | Admit: 2017-10-23 | Discharge: 2017-10-25 | DRG: 470 | Disposition: A | Discharge status: Home Health | Payer: Medicare Other | Source: Ambulatory Visit | Attending: Orthopaedic Surgery | Admitting: Orthopaedic Surgery

## 2017-10-23 DIAGNOSIS — Z23 Encounter for immunization: Secondary | ICD-10-CM

## 2017-10-23 DIAGNOSIS — E119 Type 2 diabetes mellitus without complications: Secondary | ICD-10-CM | POA: Diagnosis present

## 2017-10-23 DIAGNOSIS — M1712 Unilateral primary osteoarthritis, left knee: Principal | ICD-10-CM | POA: Diagnosis present

## 2017-10-23 DIAGNOSIS — E785 Hyperlipidemia, unspecified: Secondary | ICD-10-CM | POA: Diagnosis not present

## 2017-10-23 DIAGNOSIS — I10 Essential (primary) hypertension: Secondary | ICD-10-CM | POA: Diagnosis not present

## 2017-10-23 DIAGNOSIS — J449 Chronic obstructive pulmonary disease, unspecified: Secondary | ICD-10-CM | POA: Diagnosis not present

## 2017-10-23 DIAGNOSIS — Z79899 Other long term (current) drug therapy: Secondary | ICD-10-CM | POA: Diagnosis not present

## 2017-10-23 DIAGNOSIS — G8918 Other acute postprocedural pain: Secondary | ICD-10-CM | POA: Diagnosis not present

## 2017-10-23 DIAGNOSIS — M25562 Pain in left knee: Secondary | ICD-10-CM | POA: Diagnosis not present

## 2017-10-23 DIAGNOSIS — Z87891 Personal history of nicotine dependence: Secondary | ICD-10-CM | POA: Diagnosis not present

## 2017-10-23 HISTORY — PX: TOTAL KNEE ARTHROPLASTY: SHX125

## 2017-10-23 LAB — GLUCOSE, CAPILLARY
GLUCOSE-CAPILLARY: 141 mg/dL — AB (ref 65–99)
GLUCOSE-CAPILLARY: 119 mg/dL — AB (ref 65–99)

## 2017-10-23 SURGERY — ARTHROPLASTY, KNEE, TOTAL
Anesthesia: Spinal | Site: Knee | Laterality: Left

## 2017-10-23 MED ORDER — CHLORHEXIDINE GLUCONATE 4 % EX LIQD: 60.0000 mL | Freq: Once | CUTANEOUS | Status: DC

## 2017-10-23 MED ORDER — SODIUM CHLORIDE 0.9 % IR SOLN
Status: DC | PRN
  Administered 2017-10-23: 3000 mL

## 2017-10-23 MED ORDER — LOSARTAN POTASSIUM 50 MG PO TABS
50.0000 mg | ORAL_TABLET | Freq: Every day | ORAL | Status: DC
  Administered 2017-10-24 – 2017-10-25 (×2): 50 mg via ORAL
  Filled 2017-10-23 (×3): qty 1

## 2017-10-23 MED ORDER — METHOCARBAMOL 500 MG PO TABS
500.0000 mg | ORAL_TABLET | Freq: Four times a day (QID) | ORAL | Status: DC | PRN
  Administered 2017-10-24 – 2017-10-25 (×5): 500 mg via ORAL
  Filled 2017-10-23 (×7): qty 1

## 2017-10-23 MED ORDER — BUPIVACAINE LIPOSOME 1.3 % IJ SUSP
20.0000 mL | INTRAMUSCULAR | Status: DC
  Filled 2017-10-23: qty 20

## 2017-10-23 MED ORDER — DIPHENHYDRAMINE HCL 12.5 MG/5ML PO ELIX: 12.5000 mg | ORAL_SOLUTION | ORAL | Status: DC | PRN

## 2017-10-23 MED ORDER — HYDROMORPHONE HCL 1 MG/ML IJ SOLN
0.5000 mg | INTRAMUSCULAR | Status: DC | PRN
  Administered 2017-10-24: 1 mg via INTRAVENOUS
  Filled 2017-10-23: qty 1

## 2017-10-23 MED ORDER — PHENOL 1.4 % MT LIQD: 1.0000 | OROMUCOSAL | Status: DC | PRN

## 2017-10-23 MED ORDER — BUPIVACAINE-EPINEPHRINE (PF) 0.5% -1:200000 IJ SOLN
INTRAMUSCULAR | Status: DC | PRN
  Administered 2017-10-23: 30 mL

## 2017-10-23 MED ORDER — ASPIRIN EC 325 MG PO TBEC
325.0000 mg | DELAYED_RELEASE_TABLET | Freq: Two times a day (BID) | ORAL | Status: DC
  Administered 2017-10-23 – 2017-10-25 (×4): 325 mg via ORAL
  Filled 2017-10-23 (×4): qty 1

## 2017-10-23 MED ORDER — MIDAZOLAM HCL 5 MG/5ML IJ SOLN
INTRAMUSCULAR | Status: DC | PRN
  Administered 2017-10-23: 2 mg via INTRAVENOUS

## 2017-10-23 MED ORDER — HYDROCODONE-ACETAMINOPHEN 5-325 MG PO TABS
2.0000 | ORAL_TABLET | ORAL | Status: DC | PRN
  Administered 2017-10-23 – 2017-10-24 (×5): 2 via ORAL
  Filled 2017-10-23 (×7): qty 2

## 2017-10-23 MED ORDER — LACTATED RINGERS IV SOLN
INTRAVENOUS | Status: DC
  Administered 2017-10-23: 10:00:00 via INTRAVENOUS

## 2017-10-23 MED ORDER — ATORVASTATIN CALCIUM 40 MG PO TABS
40.0000 mg | ORAL_TABLET | Freq: Every day | ORAL | Status: DC
  Administered 2017-10-23 – 2017-10-25 (×3): 40 mg via ORAL
  Filled 2017-10-23 (×3): qty 1

## 2017-10-23 MED ORDER — PROPOFOL 500 MG/50ML IV EMUL
INTRAVENOUS | Status: DC | PRN
  Administered 2017-10-23: 50 ug/kg/min via INTRAVENOUS

## 2017-10-23 MED ORDER — HYDROMORPHONE HCL 1 MG/ML IJ SOLN: 0.2500 mg | INTRAMUSCULAR | Status: DC | PRN

## 2017-10-23 MED ORDER — LACTATED RINGERS IV SOLN
INTRAVENOUS | Status: DC
  Administered 2017-10-23 (×2): via INTRAVENOUS

## 2017-10-23 MED ORDER — DOCUSATE SODIUM 100 MG PO CAPS
100.0000 mg | ORAL_CAPSULE | Freq: Two times a day (BID) | ORAL | Status: DC
  Administered 2017-10-23 – 2017-10-25 (×4): 100 mg via ORAL
  Filled 2017-10-23 (×4): qty 1

## 2017-10-23 MED ORDER — METHOCARBAMOL 1000 MG/10ML IJ SOLN: 500.0000 mg | Freq: Four times a day (QID) | INTRAVENOUS | Status: DC | PRN

## 2017-10-23 MED ORDER — SODIUM CHLORIDE 0.9 % IJ SOLN
INTRAMUSCULAR | Status: DC | PRN
  Administered 2017-10-23: 30 mL

## 2017-10-23 MED ORDER — PHENYLEPHRINE 40 MCG/ML (10ML) SYRINGE FOR IV PUSH (FOR BLOOD PRESSURE SUPPORT)
PREFILLED_SYRINGE | INTRAVENOUS | Status: AC
  Filled 2017-10-23: qty 10

## 2017-10-23 MED ORDER — 0.9 % SODIUM CHLORIDE (POUR BTL) OPTIME
TOPICAL | Status: DC | PRN
  Administered 2017-10-23: 1000 mL

## 2017-10-23 MED ORDER — PHENYLEPHRINE HCL 10 MG/ML IJ SOLN
INTRAVENOUS | Status: DC | PRN
  Administered 2017-10-23: 30 ug/min via INTRAVENOUS

## 2017-10-23 MED ORDER — METOCLOPRAMIDE HCL 5 MG PO TABS: 5.0000 mg | ORAL_TABLET | Freq: Three times a day (TID) | ORAL | Status: DC | PRN

## 2017-10-23 MED ORDER — PROPOFOL 10 MG/ML IV BOLUS
INTRAVENOUS | Status: AC
  Filled 2017-10-23: qty 60

## 2017-10-23 MED ORDER — ROPIVACAINE HCL 5 MG/ML IJ SOLN
INTRAMUSCULAR | Status: DC | PRN
  Administered 2017-10-23: 30 mL via PERINEURAL

## 2017-10-23 MED ORDER — ONDANSETRON HCL 4 MG PO TABS: 4.0000 mg | ORAL_TABLET | Freq: Four times a day (QID) | ORAL | Status: DC | PRN

## 2017-10-23 MED ORDER — FENTANYL CITRATE (PF) 100 MCG/2ML IJ SOLN
INTRAMUSCULAR | Status: DC | PRN
  Administered 2017-10-23: 100 ug via INTRAVENOUS
  Administered 2017-10-23: 50 ug via INTRAVENOUS

## 2017-10-23 MED ORDER — ALUM & MAG HYDROXIDE-SIMETH 200-200-20 MG/5ML PO SUSP: 30.0000 mL | ORAL | Status: DC | PRN

## 2017-10-23 MED ORDER — BUPIVACAINE IN DEXTROSE 0.75-8.25 % IT SOLN
INTRATHECAL | Status: DC | PRN
  Administered 2017-10-23: 2 mL via INTRATHECAL

## 2017-10-23 MED ORDER — PROMETHAZINE HCL 25 MG/ML IJ SOLN: 6.2500 mg | INTRAMUSCULAR | Status: DC | PRN

## 2017-10-23 MED ORDER — BISACODYL 5 MG PO TBEC: 5.0000 mg | DELAYED_RELEASE_TABLET | Freq: Every day | ORAL | Status: DC | PRN

## 2017-10-23 MED ORDER — MIDAZOLAM HCL 2 MG/2ML IJ SOLN
INTRAMUSCULAR | Status: AC
  Filled 2017-10-23: qty 2

## 2017-10-23 MED ORDER — BUPIVACAINE LIPOSOME 1.3 % IJ SUSP
INTRAMUSCULAR | Status: DC | PRN
  Administered 2017-10-23: 20 mL

## 2017-10-23 MED ORDER — METOCLOPRAMIDE HCL 5 MG/ML IJ SOLN: 5.0000 mg | Freq: Three times a day (TID) | INTRAMUSCULAR | Status: DC | PRN

## 2017-10-23 MED ORDER — ONDANSETRON HCL 4 MG/2ML IJ SOLN
4.0000 mg | Freq: Four times a day (QID) | INTRAMUSCULAR | Status: DC | PRN
  Administered 2017-10-23: 4 mg via INTRAVENOUS
  Filled 2017-10-23: qty 2

## 2017-10-23 MED ORDER — DEXAMETHASONE SODIUM PHOSPHATE 10 MG/ML IJ SOLN
INTRAMUSCULAR | Status: DC | PRN
  Administered 2017-10-23: 10 mg via INTRAVENOUS

## 2017-10-23 MED ORDER — PHENYLEPHRINE HCL 10 MG/ML IJ SOLN
INTRAMUSCULAR | Status: DC | PRN
  Administered 2017-10-23: 80 ug via INTRAVENOUS
  Administered 2017-10-23 (×2): 120 ug via INTRAVENOUS

## 2017-10-23 MED ORDER — MEPERIDINE HCL 25 MG/ML IJ SOLN: 6.2500 mg | INTRAMUSCULAR | Status: DC | PRN

## 2017-10-23 MED ORDER — PROPOFOL 1000 MG/100ML IV EMUL
INTRAVENOUS | Status: AC
  Filled 2017-10-23: qty 100

## 2017-10-23 MED ORDER — MENTHOL 3 MG MT LOZG: 1.0000 | LOZENGE | OROMUCOSAL | Status: DC | PRN

## 2017-10-23 MED ORDER — FENTANYL CITRATE (PF) 250 MCG/5ML IJ SOLN
INTRAMUSCULAR | Status: AC
  Filled 2017-10-23: qty 5

## 2017-10-23 MED ORDER — CEFAZOLIN SODIUM-DEXTROSE 2-4 GM/100ML-% IV SOLN
2.0000 g | Freq: Four times a day (QID) | INTRAVENOUS | Status: AC
  Administered 2017-10-23 (×2): 2 g via INTRAVENOUS
  Filled 2017-10-23 (×2): qty 100

## 2017-10-23 MED ORDER — BUPIVACAINE-EPINEPHRINE (PF) 0.5% -1:200000 IJ SOLN
INTRAMUSCULAR | Status: AC
  Filled 2017-10-23: qty 30

## 2017-10-23 MED ORDER — ACETAMINOPHEN 650 MG RE SUPP: 650.0000 mg | RECTAL | Status: DC | PRN

## 2017-10-23 MED ORDER — ACETAMINOPHEN 325 MG PO TABS: 650.0000 mg | ORAL_TABLET | ORAL | Status: DC | PRN

## 2017-10-23 MED ORDER — SODIUM CHLORIDE 0.9 % IV SOLN
1000.0000 mg | Freq: Once | INTRAVENOUS | Status: AC
  Administered 2017-10-23: 1000 mg via INTRAVENOUS
  Filled 2017-10-23: qty 10

## 2017-10-23 MED ORDER — DEXAMETHASONE SODIUM PHOSPHATE 10 MG/ML IJ SOLN
INTRAMUSCULAR | Status: AC
  Filled 2017-10-23: qty 1

## 2017-10-23 MED ORDER — HYDROCODONE-ACETAMINOPHEN 5-325 MG PO TABS: 1.0000 | ORAL_TABLET | ORAL | Status: DC | PRN

## 2017-10-23 MED ORDER — AMLODIPINE BESYLATE 5 MG PO TABS
5.0000 mg | ORAL_TABLET | Freq: Every day | ORAL | Status: DC
  Administered 2017-10-23 – 2017-10-25 (×3): 5 mg via ORAL
  Filled 2017-10-23 (×3): qty 1

## 2017-10-23 MED ORDER — HYDROCODONE-ACETAMINOPHEN 7.5-325 MG PO TABS: 1.0000 | ORAL_TABLET | Freq: Once | ORAL | Status: DC | PRN

## 2017-10-23 SURGICAL SUPPLY — 56 items
BAG DECANTER FOR FLEXI CONT (MISCELLANEOUS) ×2 IMPLANT
BANDAGE ESMARK 6X9 LF (GAUZE/BANDAGES/DRESSINGS) ×1 IMPLANT
BLADE SAGITTAL 25.0X1.19X90 (BLADE) ×1 IMPLANT
BLADE SAGITTAL 25.0X1.19X90MM (BLADE) ×1
BLADE SAW SGTL 13.0X1.19X90.0M (BLADE) IMPLANT
BNDG CMPR 9X6 STRL LF SNTH (GAUZE/BANDAGES/DRESSINGS) ×1
BNDG CMPR MED 10X6 ELC LF (GAUZE/BANDAGES/DRESSINGS) ×1
BNDG ELASTIC 6X10 VLCR STRL LF (GAUZE/BANDAGES/DRESSINGS) ×3 IMPLANT
BNDG ESMARK 6X9 LF (GAUZE/BANDAGES/DRESSINGS) ×3
BOWL SMART MIX CTS (DISPOSABLE) ×3 IMPLANT
CAP KNEE TOTAL 3 SIGMA ×2 IMPLANT
CEMENT HV SMART SET (Cement) ×6 IMPLANT
CLOSURE WOUND 1/2 X4 (GAUZE/BANDAGES/DRESSINGS) ×1
COVER SURGICAL LIGHT HANDLE (MISCELLANEOUS) ×3 IMPLANT
CUFF TOURNIQUET SINGLE 34IN LL (TOURNIQUET CUFF) ×3 IMPLANT
CUFF TOURNIQUET SINGLE 44IN (TOURNIQUET CUFF) IMPLANT
DECANTER SPIKE VIAL GLASS SM (MISCELLANEOUS) ×1 IMPLANT
DRAPE EXTREMITY T 121X128X90 (DRAPE) ×3 IMPLANT
DRAPE HALF SHEET 40X57 (DRAPES) ×4 IMPLANT
DRAPE U-SHAPE 47X51 STRL (DRAPES) ×3 IMPLANT
DRSG AQUACEL AG ADV 3.5X10 (GAUZE/BANDAGES/DRESSINGS) ×3 IMPLANT
DURAPREP 26ML APPLICATOR (WOUND CARE) ×5 IMPLANT
ELECT CAUTERY BLADE 6.4 (BLADE) ×2 IMPLANT
ELECT REM PT RETURN 9FT ADLT (ELECTROSURGICAL) ×3
ELECTRODE REM PT RTRN 9FT ADLT (ELECTROSURGICAL) ×1 IMPLANT
GLOVE BIO SURGEON STRL SZ8 (GLOVE) ×6 IMPLANT
GLOVE BIOGEL PI IND STRL 8 (GLOVE) ×2 IMPLANT
GLOVE BIOGEL PI INDICATOR 8 (GLOVE) ×4
GOWN STRL REUS W/ TWL LRG LVL3 (GOWN DISPOSABLE) ×1 IMPLANT
GOWN STRL REUS W/ TWL XL LVL3 (GOWN DISPOSABLE) ×2 IMPLANT
GOWN STRL REUS W/TWL LRG LVL3 (GOWN DISPOSABLE) ×3
GOWN STRL REUS W/TWL XL LVL3 (GOWN DISPOSABLE) ×6
HANDPIECE INTERPULSE COAX TIP (DISPOSABLE) ×3
HOOD PEEL AWAY FACE SHEILD DIS (HOOD) ×6 IMPLANT
IMMOBILIZER KNEE 22 UNIV (SOFTGOODS) ×3 IMPLANT
KIT BASIN OR (CUSTOM PROCEDURE TRAY) ×3 IMPLANT
KIT ROOM TURNOVER OR (KITS) ×3 IMPLANT
MANIFOLD NEPTUNE II (INSTRUMENTS) ×3 IMPLANT
NDL HYPO 21X1 ECLIPSE (NEEDLE) ×1 IMPLANT
NEEDLE HYPO 21X1 ECLIPSE (NEEDLE) ×3 IMPLANT
NS IRRIG 1000ML POUR BTL (IV SOLUTION) ×3 IMPLANT
PACK TOTAL JOINT (CUSTOM PROCEDURE TRAY) ×3 IMPLANT
PAD ARMBOARD 7.5X6 YLW CONV (MISCELLANEOUS) ×8 IMPLANT
PIN STEINMAN FIXATION KNEE (PIN) ×2 IMPLANT
SET HNDPC FAN SPRY TIP SCT (DISPOSABLE) ×1 IMPLANT
STRIP CLOSURE SKIN 1/2X4 (GAUZE/BANDAGES/DRESSINGS) ×2 IMPLANT
SUT VIC AB 0 CT1 27 (SUTURE) ×6
SUT VIC AB 0 CT1 27XBRD ANBCTR (SUTURE) ×1 IMPLANT
SUT VIC AB 2-0 CT1 27 (SUTURE) ×3
SUT VIC AB 2-0 CT1 TAPERPNT 27 (SUTURE) ×1 IMPLANT
SUT VIC AB 3-0 FS2 27 (SUTURE) ×3 IMPLANT
SUT VLOC 180 0 24IN GS25 (SUTURE) ×3 IMPLANT
SYR 50ML LL SCALE MARK (SYRINGE) ×3 IMPLANT
TOWEL OR 17X24 6PK STRL BLUE (TOWEL DISPOSABLE) ×3 IMPLANT
TOWEL OR 17X26 10 PK STRL BLUE (TOWEL DISPOSABLE) ×3 IMPLANT
TRAY CATH 16FR W/PLASTIC CATH (SET/KITS/TRAYS/PACK) ×2 IMPLANT

## 2017-10-23 NOTE — Transfer of Care (Signed)
Immediate Anesthesia Transfer of Care Note  Patient: Trevor Bird  Procedure(s) Performed: TOTAL KNEE ARTHROPLASTY (Left Knee)  Patient Location: PACU  Anesthesia Type:Spinal  Level of Consciousness: awake, alert , oriented and patient cooperative  Airway & Oxygen Therapy: Patient Spontanous Breathing and Patient connected to face mask oxygen  Post-op Assessment: Report given to RN and Post -op Vital signs reviewed and stable  Post vital signs: stable  Last Vitals:  Vitals:   10/23/17 0625 10/23/17 0953  BP: 135/77 110/69  Pulse: 71 61  Resp: 20 12  Temp: 36.7 C   SpO2: 97% 97%    Last Pain:  Vitals:   10/23/17 0625  TempSrc: Oral         Complications: No apparent anesthesia complications

## 2017-10-23 NOTE — Care Management Note (Signed)
Case Management Note  Patient Details  Name: Trevor Bird MRN: [id number] Date of Birth: 1946/08/08  Subjective/Objective:  Left TKA                  Action/Plan: Discharge Planning: NCM spoke to pt and wife at bedside. Pt has RW and bedside commode. Offered choice for [facility] (preoperatively arranged) Pt agreeable to [facility] for [facility].    Expected Discharge Date:                Expected Discharge Plan:  [place]  In-House Referral:  NA  Discharge planning Services  CM Consult  Post Acute Care Choice:  Home Health Choice offered to:  Patient  DME Arranged:  3-N-1, Walker rolling, CPM DME Agency:  [employer]  HH Arranged:  PT HH Agency:  [facility] (formerly [employer])  Status of Service:  Completed, signed off  If discussed at [employer], dates discussed:    Additional Comments:  Erenest Rasher, RN 10/23/2017, 5:43 PM

## 2017-10-23 NOTE — Interval H&P Note (Signed)
History and Physical Interval Note:  10/23/2017 7:20 AM  Trevor Bird  has presented today for surgery, with the diagnosis of LEFT KNEE DEGENERATIVE JOINT DISEASE  The various methods of treatment have been discussed with the patient and family. After consideration of risks, benefits and other options for treatment, the patient has consented to  Procedure(s): TOTAL KNEE ARTHROPLASTY (Left) as a surgical intervention .  The patient's history has been reviewed, patient examined, no change in status, stable for surgery.  I have reviewed the patient's chart and labs.  Questions were answered to the patient's satisfaction.     [name] G

## 2017-10-23 NOTE — Anesthesia Procedure Notes (Signed)
Spinal  Patient location during procedure: OR Start time: 10/23/2017 7:41 AM Staffing Anesthesiologist: Josephine Igo, MD Performed: anesthesiologist  Preanesthetic Checklist Completed: patient identified, site marked, surgical consent, pre-op evaluation, timeout performed, IV checked, risks and benefits discussed and monitors and equipment checked Spinal Block Patient position: sitting Prep: site prepped and draped and DuraPrep Patient monitoring: heart rate, cardiac monitor, continuous pulse ox and blood pressure Approach: midline Location: L3-4 Injection technique: single-shot Needle Needle type: Pencan  Needle gauge: 24 G Needle length: 9 cm Needle insertion depth: 7 cm Assessment Sensory level: T4 Additional Notes Patient tolerated procedure well. Adequate sensory level.

## 2017-10-23 NOTE — Evaluation (Signed)
Physical Therapy Evaluation Patient Details Name: Trevor Bird MRN: [id number] DOB: 1946-07-23 Today's Date: 10/23/2017   History of Present Illness  Pt is a 71 y/o male s/p elective L TKA. PMH includes aortic aneurysm s/p repair, DM, HTN, COPD, and R eye surgery.   Clinical Impression  Pt is s/p surgery above with deficits below. PTA, pt was independent with functional mobility. Upon eval, pt presenting with pain and weakness, decreased balance, and some lightheadedness during gait. Required min to min guard assist with mobility with RW. Distance limited secondary to lightheadedness. Pt slightly impulsive and required some cues for attention to task, however, feel this is likely baseline for pt. Pt reports sons will be able to assist as needed upon d/c and has all necessary DME. Reports he will be getting HHPT at d/c. Will continue to follow acutely to maximize functional mobility independence and safety.     Follow Up Recommendations DC plan and follow up therapy as arranged by surgeon;Supervision for mobility/OOB    Equipment Recommendations  None recommended by PT    Recommendations for Other Services       Precautions / Restrictions Precautions Precautions: Knee Precaution Booklet Issued: Yes (comment) Precaution Comments: Reviewed supine knee ther ex with pt.  Required Braces or Orthoses: Knee Immobilizer - Left Knee Immobilizer - Left: Other (comment)(until discontinued ) Restrictions Weight Bearing Restrictions: Yes LLE Weight Bearing: Weight bearing as tolerated      Mobility  Bed Mobility Overal bed mobility: Needs Assistance Bed Mobility: Supine to Sit     Supine to sit: Min guard     General bed mobility comments: Min guard for steadying assist   Transfers Overall transfer level: Needs assistance Equipment used: Rolling walker (2 wheeled) Transfers: Sit to/from Stand Sit to Stand: Min assist         General transfer comment: Min A for steadying  assist. Verbal cues for safe hand placement.   Ambulation/Gait Ambulation/Gait assistance: Min guard Ambulation Distance (Feet): 5 Feet Assistive device: Rolling walker (2 wheeled) Gait Pattern/deviations: Step-to pattern;Decreased step length - right;Decreased step length - left;Decreased weight shift to left;Antalgic Gait velocity: Decreased Gait velocity interpretation: Below normal speed for age/gender General Gait Details: Slow, antalgic gait. Slight unsteadyiness noted. Required redirection to task a few times as pt easily distracted. Pt reporting some light headedness, so distance limited this session.   Stairs            Wheelchair Mobility    Modified Rankin (Stroke Patients Only)       Balance Overall balance assessment: Needs assistance Sitting-balance support: No upper extremity supported;Feet supported Sitting balance-Leahy Scale: Good     Standing balance support: Bilateral upper extremity supported;During functional activity Standing balance-Leahy Scale: Poor Standing balance comment: Reliant on UE support for balance.                              Pertinent Vitals/Pain Pain Assessment: 0-10 Pain Score: 5  Pain Location: L knee  Pain Descriptors / Indicators: Aching;Operative site guarding Pain Intervention(s): Limited activity within patient's tolerance;Monitored during session;Repositioned    Home Living Family/patient expects to be discharged to:: Private residence Living Arrangements: Children Available Help at Discharge: Family;Available 24 hours/day Type of Home: House Home Access: Stairs to enter Entrance Stairs-Rails: Can reach both;Left;Right Entrance Stairs-Number of Steps: 3 Home Layout: One level Home Equipment: Walker - 2 wheels;Bedside commode      Prior Function Level of Independence:  Independent               Hand Dominance        Extremity/Trunk Assessment   Upper Extremity Assessment Upper Extremity  Assessment: Defer to OT evaluation    Lower Extremity Assessment Lower Extremity Assessment: LLE deficits/detail LLE Deficits / Details: Sensory in tact. Deficits consistent with post op pain and weakness. Able to perform ther ex below.     Cervical / Trunk Assessment Cervical / Trunk Assessment: Normal  Communication   Communication: No difficulties  Cognition Arousal/Alertness: Awake/alert Behavior During Therapy: WFL for tasks assessed/performed Overall Cognitive Status: Within Functional Limits for tasks assessed                                 General Comments: Pt very chatty throughout session. Slight impulsivity noted and required redirection to task at times. Feel this is likely baseline.       General Comments General comments (skin integrity, edema, etc.): Pt's sons present during session.     Exercises Total Joint Exercises Ankle Circles/Pumps: AROM;Both;20 reps Quad Sets: AROM;Left;10 reps Towel Squeeze: AROM;Both;10 reps Heel Slides: AROM;Left;10 reps Hip ABduction/ADduction: AROM;Left;10 reps   Assessment/Plan    PT Assessment Patient needs continued PT services  PT Problem List Decreased strength;Decreased range of motion;Decreased balance;Decreased activity tolerance;Decreased mobility;Decreased knowledge of use of DME;Decreased safety awareness;Pain       PT Treatment Interventions DME instruction;Stair training;Gait training;Functional mobility training;Therapeutic activities;Therapeutic exercise;Balance training;Neuromuscular re-education;Patient/family education    PT Goals (Current goals can be found in the Care Plan section)  Acute Rehab PT Goals Patient Stated Goal: to go home  PT Goal Formulation: With patient Time For Goal Achievement: 10/30/17 Potential to Achieve Goals: Good    Frequency 7X/week   Barriers to discharge        Co-evaluation               AM-PAC PT "6 Clicks" Daily Activity  Outcome Measure  Difficulty turning over in bed (including adjusting bedclothes, sheets and blankets)?: A Little Difficulty moving from lying on back to sitting on the side of the bed? : Unable Difficulty sitting down on and standing up from a chair with arms (e.g., wheelchair, bedside commode, etc,.)?: Unable Help needed moving to and from a bed to chair (including a wheelchair)?: A Little Help needed walking in hospital room?: A Little Help needed climbing 3-5 steps with a railing? : A Lot 6 Click Score: 13    End of Session Equipment Utilized During Treatment: Gait belt;Left knee immobilizer Activity Tolerance: Treatment limited secondary to medical complications (Comment)(light headedness ) Patient left: in chair;with call bell/phone within reach;with family/visitor present Nurse Communication: Mobility status PT Visit Diagnosis: Other abnormalities of gait and mobility (R26.89);Pain Pain - Right/Left: Left Pain - part of body: Knee    Time: 7824-2353 PT Time Calculation (min) (ACUTE ONLY): 29 min   Charges:   PT Evaluation $PT Eval Low Complexity: 1 Low PT Treatments $Therapeutic Exercise: 8-22 mins   PT G Codes:        Leighton Ruff, PT, DPT  Acute Rehabilitation Services  Pager: [phone]   Rudean Hitt 10/23/2017, 3:24 PM

## 2017-10-23 NOTE — Op Note (Signed)
PREOP DIAGNOSIS: DJD LEFT KNEE POSTOP DIAGNOSIS:  same PROCEDURE: LEFT TKR ANESTHESIA: Spinal and MAC ATTENDING SURGEON: Chilton Sallade G ASSISTANT: Loni Dolly PA  INDICATIONS FOR PROCEDURE: Trevor Bird is a 71 y.o. male who has struggled for a long time with pain due to degenerative arthritis of the left knee.  The patient has failed many conservative non-operative measures and at this point has pain which limits the ability to sleep and walk.  The patient is offered total knee replacement.  Informed operative consent was obtained after discussion of possible risks of anesthesia, infection, neurovascular injury, DVT, and death.  The importance of the post-operative rehabilitation protocol to optimize result was stressed extensively with the patient.  SUMMARY OF FINDINGS AND PROCEDURE:  Trevor Bird was taken to the operative suite where under the above anesthesia a left knee replacement was performed.  There were advanced degenerative changes and the bone quality was excellent.  We used the DePuyLCS system and placed size large femur, 5 tibia, 41 mm all polyethylene patella, and a size 10 mm spacer.  Loni Dolly PA-C assisted throughout and was invaluable to the completion of the case in that he helped retract and maintain exposure while I placed the components.  He also helped close thereby minimizing OR time.  The patient was admitted for appropriate post-op care to include perioperative antibiotics and mechanical and pharmacologic measures for DVT prophylaxis.  DESCRIPTION OF PROCEDURE:  Trevor Bird was taken to the operative suite where the above anesthesia was applied.  The patient was positioned supine and prepped and draped in normal sterile fashion.  An appropriate time out was performed.  After the administration of kefzol pre-op antibiotic the leg was elevated and exsanguinated and a tourniquet inflated.  A standard longitudinal incision was made on the anterior knee.  Dissection  was carried down to the extensor mechanism.  All appropriate anti-infective measures were used including the pre-operative antibiotic, betadine impregnated drape, and closed hooded exhaust systems for each member of the surgical team.  A medial parapatellar incision was made in the extensor mechanism and the knee cap flipped and the knee flexed.  Some residual meniscal tissues were removed along with any remaining ACL/PCL tissue.  A guide was placed on the tibia and a flat cut was made on it's superior surface.  An intramedullary guide was placed in the femur and was utilized to make anterior and posterior cuts creating an appropriate flexion gap.  A second intramedullary guide was placed in the femur to make a distal cut properly balancing the knee with an extension gap equal to the flexion gap.  The three bones sized to the above mentioned sizes and the appropriate guides were placed and utilized.  A trial reduction was done and the knee easily came to full extension and the patella tracked well on flexion.  The trial components were removed and all bones were cleaned with pulsatile lavage and then dried thoroughly.  Cement was mixed and was pressurized onto the bones followed by placement of the aforementioned components.  Excess cement was trimmed and pressure was held on the components until the cement had hardened.  The tourniquet was deflated and a small amount of bleeding was controlled with cautery and pressure.  The knee was irrigated thoroughly.  The extensor mechanism was re-approximated with V-loc suture in running fashion.  The knee was flexed and the repair was solid.  The subcutaneous tissues were re-approximated with #0 and #2-0 vicryl and the skin closed  with a subcuticular stitch and steristrips.  A sterile dressing was applied.  Intraoperative fluids, EBL, and tourniquet time can be obtained from anesthesia records.  DISPOSITION:  The patient was taken to recovery room in stable condition and  admitted for appropriate post-op care to include peri-operative antibiotic and DVT prophylaxis with mechanical and pharmacologic measures.  [name] 10/23/2017, 9:22 AM

## 2017-10-23 NOTE — Anesthesia Procedure Notes (Signed)
Procedure Name: MAC Date/Time: 10/23/2017 7:35 AM Performed by: Lissa Morales, CRNA Pre-anesthesia Checklist: Patient identified, Emergency Drugs available, Suction available and Patient being monitored Patient Re-evaluated:Patient Re-evaluated prior to induction Oxygen Delivery Method: Simple face mask Airway Equipment and Method: Oral airway Placement Confirmation: positive ETCO2 Dental Injury: Teeth and Oropharynx as per pre-operative assessment

## 2017-10-23 NOTE — Progress Notes (Signed)
Orthopedic Tech Progress Note Patient Details:  Trevor Bird [birth year] [id number]  CPM Left Knee CPM Left Knee: On Left Knee Flexion (Degrees): 90 Left Knee Extension (Degrees): 0 Additional Comments: foot roll   [name] 10/23/2017, 11:32 AM

## 2017-10-23 NOTE — Anesthesia Postprocedure Evaluation (Signed)
Anesthesia Post Note  Patient: Trevor Bird  Procedure(s) Performed: TOTAL KNEE ARTHROPLASTY (Left Knee)     Patient location during evaluation: PACU Anesthesia Type: Spinal Level of consciousness: oriented and awake and alert Pain management: pain level controlled Vital Signs Assessment: post-procedure vital signs reviewed and stable Respiratory status: spontaneous breathing, respiratory function stable and nonlabored ventilation Cardiovascular status: blood pressure returned to baseline and stable Postop Assessment: no headache, no backache, no apparent nausea or vomiting, spinal receding and patient able to bend at knees Anesthetic complications: no    Last Vitals:  Vitals:   10/23/17 1043 10/23/17 1056  BP:  124/70  Pulse: 65 64  Resp: 20 17  Temp:  (!) 36.1 C  SpO2: 94% 95%    Last Pain:  Vitals:   10/23/17 1056  TempSrc:   PainSc: 0-No pain                 Larence Thone A.

## 2017-10-23 NOTE — Anesthesia Procedure Notes (Signed)
Anesthesia Regional Block: Adductor canal block   Pre-Anesthetic Checklist: ,, timeout performed, Correct Patient, Correct Site, Correct Laterality, Correct Procedure, Correct Position, site marked, Risks and benefits discussed,  Surgical consent,  Pre-op evaluation,  At surgeon's request and post-op pain management  Laterality: Left  Prep: chloraprep       Needles:  Injection technique: Single-shot  Needle Type: Echogenic Stimulator Needle     Needle Length: 9cm  Needle Gauge: 21   Needle insertion depth: 5 cm   Additional Needles:   Procedures:,,,, ultrasound used (permanent image in chart),,,,  Narrative:  Start time: 10/23/2017 7:18 AM End time: 10/23/2017 7:23 AM Injection made incrementally with aspirations every 5 mL.  Performed by: Personally  Anesthesiologist: Josephine Igo, MD  Additional Notes: Timeout performed. Patient sedated. Relevant anatomy ID'd using [place]. Incremental 2-6ml injection of LA with frequent aspiration. Patient tolerated procedure well.

## 2017-10-24 ENCOUNTER — Encounter (HOSPITAL_COMMUNITY): Payer: Self-pay | Admitting: Orthopaedic Surgery

## 2017-10-24 ENCOUNTER — Other Ambulatory Visit: Payer: Self-pay

## 2017-10-24 MED ORDER — PNEUMOCOCCAL VAC POLYVALENT 25 MCG/0.5ML IJ INJ
0.5000 mL | INJECTION | INTRAMUSCULAR | Status: AC
  Administered 2017-10-25: 0.5 mL via INTRAMUSCULAR
  Filled 2017-10-24: qty 0.5

## 2017-10-24 NOTE — Progress Notes (Signed)
Physical Therapy Treatment Patient Details Name: Trevor Bird MRN: [id number] DOB: 17-Jun-1946 Today's Date: 10/24/2017    History of Present Illness Pt is a 71 y/o male s/p elective L TKA. PMH includes aortic aneurysm s/p repair, DM, HTN, COPD, and R eye surgery.     PT Comments    Continuing work on functional mobility and activity tolerance;  Excellent progress with L knee ROM; good knee control with SLR; doesn't need KI for knee stability; on track for dc tomorrow   Follow Up Recommendations  DC plan and follow up therapy as arranged by surgeon;Supervision for mobility/OOB     Equipment Recommendations  None recommended by PT    Recommendations for Other Services       Precautions / Restrictions Precautions Precautions: Knee Precaution Booklet Issued: Yes (comment) Precaution Comments: Reviewed supine knee ther ex with pt.  Required Braces or Orthoses: Knee Immobilizer - Left Knee Immobilizer - Left: Other (comment)(until discontinued ) Restrictions LLE Weight Bearing: Weight bearing as tolerated    Mobility  Bed Mobility Overal bed mobility: Needs Assistance Bed Mobility: Supine to Sit     Supine to sit: Supervision     General bed mobility comments: Supervision and cues for technqiue and safety; HOB elevated  Transfers Overall transfer level: Needs assistance Equipment used: Rolling walker (2 wheeled) Transfers: Sit to/from Stand Sit to Stand: Min guard         General transfer comment: Cues for hand placement and safety  Ambulation/Gait Ambulation/Gait assistance: Min guard Ambulation Distance (Feet): 100 Feet Assistive device: Rolling walker (2 wheeled) Gait Pattern/deviations: Step-through pattern(emerging) Gait velocity: Decreased   General Gait Details: Cues to activate quad for L stance stability   Stairs            Wheelchair Mobility    Modified Rankin (Stroke Patients Only)       Balance     Sitting balance-Leahy  Scale: Good       Standing balance-Leahy Scale: (approaching Fair)                              Cognition Arousal/Alertness: Awake/alert Behavior During Therapy: WFL for tasks assessed/performed Overall Cognitive Status: Within Functional Limits for tasks assessed                                 General Comments: Pt very chatty throughout session. Slight impulsivity noted and required redirection to task at times. Feel this is likely baseline.       Exercises Total Joint Exercises Ankle Circles/Pumps: AROM;Both;20 reps Quad Sets: AROM;Left;10 reps Short Arc Quad: AROM;Left;10 reps Heel Slides: AROM;AAROM;Left;10 reps Straight Leg Raises: AROM;Left;10 reps Goniometric ROM: approx 2-90 deg    General Comments        Pertinent Vitals/Pain Pain Assessment: Faces Faces Pain Scale: Hurts little more Pain Location: L knee  Pain Descriptors / Indicators: Aching;Operative site guarding Pain Intervention(s): Monitored during session    Home Living                      Prior Function            PT Goals (current goals can now be found in the care plan section) Acute Rehab PT Goals Patient Stated Goal: to go home  PT Goal Formulation: With patient Time For Goal Achievement: 10/30/17 Potential to Achieve Goals: Good Progress  towards PT goals: Progressing toward goals    Frequency    7X/week      PT Plan Current plan remains appropriate    Co-evaluation              AM-PAC PT "6 Clicks" Daily Activity  Outcome Measure  Difficulty turning over in bed (including adjusting bedclothes, sheets and blankets)?: A Little Difficulty moving from lying on back to sitting on the side of the bed? : A Little Difficulty sitting down on and standing up from a chair with arms (e.g., wheelchair, bedside commode, etc,.)?: A Little Help needed moving to and from a bed to chair (including a wheelchair)?: A Little Help needed walking in  hospital room?: A Little Help needed climbing 3-5 steps with a railing? : A Little 6 Click Score: 18    End of Session Equipment Utilized During Treatment: Gait belt;Left knee immobilizer Activity Tolerance: Patient tolerated treatment well Patient left: in chair;with call bell/phone within reach Nurse Communication: Mobility status PT Visit Diagnosis: Other abnormalities of gait and mobility (R26.89);Pain Pain - Right/Left: Left Pain - part of body: Knee     Time: 3016-0109 PT Time Calculation (min) (ACUTE ONLY): 37 min  Charges:  $Gait Training: 8-22 mins $Therapeutic Exercise: 8-22 mins                    G Codes:       Roney Marion, PT  Acute Rehabilitation Services Pager [phone] Office [phone]    Colletta [place] 10/24/2017, 12:37 PM

## 2017-10-24 NOTE — Progress Notes (Signed)
Subjective: 1 Day Post-Op Procedure(s) (LRB): TOTAL KNEE ARTHROPLASTY (Left)  Activity level:  wbat Diet tolerance:  ok Voiding:  ok Patient reports pain as mild.    Objective: Vital signs in last 24 hours: Temp:  [97 F (36.1 C)-97.6 F (36.4 C)] 97.6 F (36.4 C) (11/14 0405) Pulse Rate:  [61-73] 73 (11/14 0405) Resp:  [12-20] 17 (11/14 0405) BP: (109-130)/(67-89) 130/75 (11/14 0405) SpO2:  [90 %-97 %] 95 % (11/14 0405)  Labs: No results for input(s): HGB in the last 72 hours. No results for input(s): WBC, RBC, HCT, PLT in the last 72 hours. No results for input(s): NA, K, CL, CO2, BUN, CREATININE, GLUCOSE, CALCIUM in the last 72 hours. No results for input(s): LABPT, INR in the last 72 hours.  Physical Exam:  Neurologically intact ABD soft Neurovascular intact Sensation intact distally Intact pulses distally Dorsiflexion/Plantar flexion intact Incision: dressing C/D/I and no drainage No cellulitis present Compartment soft  Assessment/Plan:  1 Day Post-Op Procedure(s) (LRB): TOTAL KNEE ARTHROPLASTY (Left) Advance diet Up with therapy D/C IV fluids Plan for discharge tomorrow Discharge home with home health tomorrow if doing well and cleared by PT. Continue on 325mg  ASA BID x 2 weeks post op. Follow up in office 2 weeks post op I will remove ace wrap tomorrow.  Ravon Mcilhenny, Larwance Sachs 10/24/2017, 7:42 AM

## 2017-10-24 NOTE — Evaluation (Signed)
Occupational Therapy Evaluation Patient Details Name: Trevor Bird MRN: [id number] DOB: May 16, 1946 Today's Date: 10/24/2017    History of Present Illness Pt is a 71 y/o male s/p elective L TKA. PMH includes aortic aneurysm s/p repair, DM, HTN, COPD, and R eye surgery.    Clinical Impression   Pt with decline in function and safety with ADLs and ADL mobility with decreased balance and endurance. Pt would benefit from acute OT services to maximize level of function and safety    Follow Up Recommendations  DC plan and follow up therapy as arranged by surgeon;Supervision - Intermittent    Equipment Recommendations  None recommended by OT;3 in 1 bedside commode;Tub/shower seat    Recommendations for Other Services       Precautions / Restrictions Precautions Precautions: Knee Precaution Booklet Issued: Yes (comment) Precaution Comments: reviewd no pillow under knee Required Braces or Orthoses: Knee Immobilizer - Left Knee Immobilizer - Left: Other (comment);On at all times(until discontinued) Restrictions Weight Bearing Restrictions: Yes LLE Weight Bearing: Weight bearing as tolerated      Mobility Bed Mobility Overal bed mobility: Needs Assistance Bed Mobility: Supine to Sit     Supine to sit: Supervision     General bed mobility comments: pt OOB in recliner  Transfers Overall transfer level: Needs assistance Equipment used: Rolling walker (2 wheeled) Transfers: Sit to/from Stand Sit to Stand: Min guard         General transfer comment: Cues for hand placement and safety    Balance Overall balance assessment: Needs assistance Sitting-balance support: No upper extremity supported;Feet supported Sitting balance-Leahy Scale: Good     Standing balance support: Bilateral upper extremity supported;During functional activity Standing balance-Leahy Scale: Fair                             ADL either performed or assessed with clinical judgement    ADL Overall ADL's : Needs assistance/impaired     Grooming: Wash/dry hands;Wash/dry face;Standing;Min guard   Upper Body Bathing: Set up   Lower Body Bathing: Minimal assistance   Upper Body Dressing : Set up   Lower Body Dressing: Minimal assistance   Toilet Transfer: Min guard;Comfort height toilet;RW;Grab bars;Cueing for safety   Toileting- Clothing Manipulation and Hygiene: Min guard;Sit to/from stand   Tub/ Shower Transfer: Min guard;3 in 1;Cueing for safety;Rolling walker;Shower seat;Ambulation;Grab bars   Functional mobility during ADLs: Min guard;Rolling walker;+2 for safety/equipment       Vision Baseline Vision/History: Wears glasses Wears Glasses: At all times Patient Visual Report: No change from baseline       Perception     Praxis      Pertinent Vitals/Pain Pain Assessment: 0-10 Pain Score: 3  Faces Pain Scale: Hurts little more Pain Location: L knee  Pain Descriptors / Indicators: Aching;Operative site guarding Pain Intervention(s): Monitored during session;Premedicated before session;Repositioned     Hand Dominance Right   Extremity/Trunk Assessment Upper Extremity Assessment Upper Extremity Assessment: Overall WFL for tasks assessed       Cervical / Trunk Assessment Cervical / Trunk Assessment: Normal   Communication Communication Communication: No difficulties   Cognition Arousal/Alertness: Awake/alert Behavior During Therapy: WFL for tasks assessed/performed Overall Cognitive Status: Within Functional Limits for tasks assessed                                 General Comments: Pt very chatty throughout  session. Slight impulsivity noted and required redirection to task at times. Feel this is likely baseline.    General Comments   pt pleasant and cooperative; very talkative    Exercises    Shoulder Instructions      Home Living Family/patient expects to be discharged to:: Private residence Living Arrangements:  Children Available Help at Discharge: Family;Available 24 hours/day Type of Home: House Home Access: Stairs to enter [employer] of Steps: 3 Entrance Stairs-Rails: Can reach both;Left;Right Home Layout: One level     Bathroom Shower/Tub: Teacher, early years/pre: Standard     Home Equipment: Environmental consultant - 2 wheels;Bedside commode          Prior Functioning/Environment Level of Independence: Independent                 OT Problem List: Decreased activity tolerance;Decreased knowledge of use of DME or AE;Impaired balance (sitting and/or standing);Pain      OT Treatment/Interventions: Self-care/ADL training;DME and/or AE instruction;Therapeutic activities;Therapeutic exercise;Patient/family education    OT Goals(Current goals can be found in the care plan section) Acute Rehab OT Goals Patient Stated Goal: to go home  OT Goal Formulation: With patient Time For Goal Achievement: 10/31/17 Potential to Achieve Goals: Good ADL Goals Pt Will Perform Grooming: with set-up;with supervision;standing Pt Will Perform Lower Body Bathing: with min guard assist;with supervision;with set-up;with caregiver independent in assisting Pt Will Perform Lower Body Dressing: with min guard assist;with supervision;with set-up;sit to/from stand;with caregiver independent in assisting Pt Will Transfer to Toilet: with supervision;ambulating Pt Will Perform Toileting - Clothing Manipulation and hygiene: with supervision;with caregiver independent in assisting Pt Will Perform Tub/Shower Transfer: with supervision;with caregiver independent in assisting;shower seat;3 in 1;grab bars;rolling walker;ambulating  OT Frequency: Min 2X/week   Barriers to D/C:    no barriers       Co-evaluation              AM-PAC PT "6 Clicks" Daily Activity     Outcome Measure Help from another person eating meals?: None Help from another person taking care of personal grooming?: A Little Help  from another person toileting, which includes using toliet, bedpan, or urinal?: A Little Help from another person bathing (including washing, rinsing, drying)?: A Little Help from another person to put on and taking off regular upper body clothing?: A Little Help from another person to put on and taking off regular lower body clothing?: A Little 6 Click Score: 19   End of Session Equipment Utilized During Treatment: Gait belt;Rolling walker;Other (comment)(3 in 1) CPM Left Knee CPM Left Knee: Off  Activity Tolerance: Patient tolerated treatment well Patient left: in chair;with call bell/phone within reach  OT Visit Diagnosis: Unsteadiness on feet (R26.81);Pain Pain - Right/Left: Left Pain - part of body: Knee                Time: 3662-9476 OT Time Calculation (min): 20 min Charges:  OT General Charges $OT Visit: 1 Visit OT Evaluation $OT Eval Low Complexity: 1 Low G-Codes: OT G-codes **NOT FOR INPATIENT CLASS** Functional Assessment Tool Used: AM-PAC 6 Clicks Daily Activity     Britt Bottom 10/24/2017, 12:59 PM

## 2017-10-24 NOTE — Progress Notes (Signed)
Physical Therapy Treatment Patient Details Name: Trevor Bird MRN: [id number] DOB: [birth year] Today's Date: 10/24/2017    History of Present Illness Pt is a 71 y/o male s/p elective L TKA. PMH includes aortic aneurysm s/p repair, DM, HTN, COPD, and R eye surgery.     PT Comments    Continuing work on functional mobility and activity tolerance;  Stair training done; on track for [employer] home tomorrow   Follow Up Recommendations  DC plan and follow up therapy as arranged by surgeon;Supervision for mobility/OOB     Equipment Recommendations  None recommended by PT    Recommendations for Other Services       Precautions / Restrictions Precautions Precautions: Knee Precaution Booklet Issued: Yes (comment) Precaution Comments: reviewd no pillow under knee Required Braces or Orthoses: Knee Immobilizer - Left Knee Immobilizer - Left: Other (comment);On at all times(until discontinued) Restrictions Weight Bearing Restrictions: Yes LLE Weight Bearing: Weight bearing as tolerated    Mobility  Bed Mobility Overal bed mobility: Needs Assistance Bed Mobility: Sit to Supine     Supine to sit: Supervision Sit to supine: Supervision   General bed mobility comments: Cues for technique  Transfers Overall transfer level: Needs assistance Equipment used: Rolling walker (2 wheeled) Transfers: Sit to/from Stand Sit to Stand: Min guard         General transfer comment: Cues for hand placement and safety  Ambulation/Gait Ambulation/Gait assistance: Min guard Ambulation Distance (Feet): 120 Feet Assistive device: Rolling walker (2 wheeled) Gait Pattern/deviations: Step-through pattern;Decreased stance time - left Gait velocity: Decreased   General Gait Details: Cues to activate quad for L stance stability, and to delf-monitor for activity tolerance   Stairs Stairs: Yes   Stair Management: Two rails;Step to pattern;Forwards Number of Stairs: 5 General stair comments: Cues  for sequence  Wheelchair Mobility    Modified Rankin (Stroke Patients Only)       Balance Overall balance assessment: Needs assistance Sitting-balance support: No upper extremity supported;Feet supported Sitting balance-Leahy Scale: Good     Standing balance support: Bilateral upper extremity supported;During functional activity Standing balance-Leahy Scale: Fair                              Cognition Arousal/Alertness: Awake/alert Behavior During Therapy: WFL for tasks assessed/performed Overall Cognitive Status: Within Functional Limits for tasks assessed                                 General Comments: Pt very chatty throughout session. Slight impulsivity noted and required redirection to task at times. Feel this is likely baseline.       Exercises Total Joint Exercises Ankle Circles/Pumps: AROM;Both;20 reps Quad Sets: AROM;Left;10 reps Short Arc Quad: AROM;Left;10 reps Heel Slides: AROM;AAROM;Left;10 reps Straight Leg Raises: AROM;Left;10 reps Goniometric ROM: approx 2-90 deg    General Comments        Pertinent Vitals/Pain Pain Assessment: Faces Pain Score: 3  Faces Pain Scale: Hurts even more Pain Location: L knee  Pain Descriptors / Indicators: Aching;Operative site guarding Pain Intervention(s): Monitored during session;Patient requesting pain meds-RN notified    Home Living Family/patient expects to be discharged to:: Private residence Living Arrangements: Children Available Help at Discharge: Family;Available 24 hours/day Type of Home: House Home Access: Stairs to enter Entrance Stairs-Rails: Can reach both;Left;Right Home Layout: One level Home Equipment: Walker - 2 wheels;Bedside commode  Prior Function Level of Independence: Independent          PT Goals (current goals can now be found in the care plan section) Acute Rehab PT Goals Patient Stated Goal: to go home  PT Goal Formulation: With patient Time For  Goal Achievement: 10/30/17 Potential to Achieve Goals: Good Progress towards PT goals: Progressing toward goals    Frequency    7X/week      PT Plan Current plan remains appropriate    Co-evaluation              AM-PAC PT "6 Clicks" Daily Activity  Outcome Measure  Difficulty turning over in bed (including adjusting bedclothes, sheets and blankets)?: A Little Difficulty moving from lying on back to sitting on the side of the bed? : A Little Difficulty sitting down on and standing up from a chair with arms (e.g., wheelchair, bedside commode, etc,.)?: A Little Help needed moving to and from a bed to chair (including a wheelchair)?: A Little Help needed walking in hospital room?: A Little Help needed climbing 3-5 steps with a railing? : A Little 6 Click Score: 18    End of Session Equipment Utilized During Treatment: Gait belt;Left knee immobilizer Activity Tolerance: Patient tolerated treatment well Patient left: in bed;with call bell/phone within reach;in CPM Nurse Communication: Mobility status PT Visit Diagnosis: Other abnormalities of gait and mobility (R26.89);Pain Pain - Right/Left: Left Pain - part of body: Knee     Time: 4920-1007 PT Time Calculation (min) (ACUTE ONLY): 29 min  Charges:  $Gait Training: 23-37 mins $Therapeutic Exercise: 8-22 mins                    G Codes:       Roney Marion, PT  Acute Rehabilitation Services Pager [phone] Office [place] 10/24/2017, 3:50 PM

## 2017-10-25 MED ORDER — HYDROCODONE-ACETAMINOPHEN 7.5-325 MG PO TABS
1.0000 | ORAL_TABLET | ORAL | Status: DC | PRN
  Administered 2017-10-25 (×2): 2 via ORAL
  Filled 2017-10-25 (×2): qty 2

## 2017-10-25 MED ORDER — TIZANIDINE HCL 4 MG PO TABS: 4.0000 mg | ORAL_TABLET | Freq: Four times a day (QID) | ORAL | 1 refills | Status: DC | PRN

## 2017-10-25 MED ORDER — BISACODYL 5 MG PO TBEC: 10.0000 mg | DELAYED_RELEASE_TABLET | Freq: Every day | ORAL | 0 refills | Status: DC | PRN

## 2017-10-25 MED ORDER — DOCUSATE SODIUM 100 MG PO CAPS: 100.0000 mg | ORAL_CAPSULE | Freq: Two times a day (BID) | ORAL | 0 refills | Status: DC

## 2017-10-25 MED ORDER — HYDROCODONE-ACETAMINOPHEN 7.5-325 MG PO TABS: 1.0000 | ORAL_TABLET | ORAL | 0 refills | Status: DC | PRN

## 2017-10-25 MED ORDER — ASPIRIN 325 MG PO TBEC
325.0000 mg | DELAYED_RELEASE_TABLET | Freq: Two times a day (BID) | ORAL | 0 refills | Status: DC
Start: 2017-10-25 — End: 2018-10-28

## 2017-10-25 NOTE — Progress Notes (Signed)
Pt given discharge paperwork and gone over with him, answered any questions he had to satisfaction. Son was given prescriptions to go get them filled for him. Pt in no distress at discharge and son to carry him home.

## 2017-10-25 NOTE — Plan of Care (Signed)
Pt continues to progress toward goals, improved safety noted with transfers, now able to mobilize from supine to seated EOB without bed controls for trunk support.   10:28 AM, 10/25/17 Etta Grandchild, PT, DPT [facility] Physical Therapist - [place] [phone] (Pager)  [phone] [facility])  [phone] (Office)

## 2017-10-25 NOTE — Discharge Summary (Signed)
Patient ID: Trevor Bird MRN: [id number] DOB/AGE: 72-Feb-1947 72 y.o.  Admit date: 10/23/2017 Discharge date: 10/25/2017  Admission Diagnoses:  Principal Problem:   Primary localized osteoarthritis of left knee Active Problems:   Primary osteoarthritis of left knee   Discharge Diagnoses:  Same  Past Medical History:  Diagnosis Date  . Aortic aneurysm ([place])   . Arthritis   . COPD (chronic obstructive pulmonary disease) ([place])    was diagnosed but not anymore since he stopped smoking per patient  . Diabetes mellitus without complication ([place])    diet controlled, patient denies  . Headache   . Hyperlipidemia   . Hypertension   . Umbilical hernia     Surgeries: Procedure(s): TOTAL KNEE ARTHROPLASTY on 10/23/2017   Consultants:   Discharged Condition: Improved  Hospital Course: Trevor Bird is an 71 y.o. male who was admitted 10/23/2017 for operative treatment ofPrimary localized osteoarthritis of left knee. Patient has severe unremitting pain that affects sleep, daily activities, and work/hobbies. After pre-op clearance the patient was taken to the operating room on 10/23/2017 and underwent  Procedure(s): TOTAL KNEE ARTHROPLASTY.    Patient was given perioperative antibiotics:  Anti-infectives (From admission, onward)   Start     Dose/Rate Route Frequency Ordered Stop   10/23/17 1400  ceFAZolin (ANCEF) IVPB 2g/100 mL premix     2 g 200 mL/hr over 30 Minutes Intravenous Every 6 hours 10/23/17 1125 10/24/17 0326   10/23/17 0700  ceFAZolin (ANCEF) IVPB 2g/100 mL premix     2 g 200 mL/hr over 30 Minutes Intravenous To [facility] 10/22/17 1409 10/23/17 0747       Patient was given sequential compression devices, early ambulation, and chemoprophylaxis to prevent DVT.  Patient benefited maximally from hospital stay and there were no complications.    Recent vital signs:  Patient Vitals for the past 24 hrs:  BP Temp Temp src Pulse Resp SpO2  10/25/17 0640  (!) 155/81 97.7 F (36.5 C) Oral 64 16 96 %  10/24/17 2101 (!) 158/67 97.8 F (36.6 C) Oral 63 16 95 %  10/24/17 1418 111/63 97.7 F (36.5 C) Oral 64 16 95 %     Recent laboratory studies: No results for input(s): WBC, HGB, HCT, PLT, NA, K, CL, CO2, BUN, CREATININE, GLUCOSE, INR, CALCIUM in the last 72 hours.  Invalid input(s): PT, 2   Discharge Medications:   Allergies as of 10/25/2017   No Known Allergies     Medication List    STOP taking these medications   ibuprofen 200 MG tablet Commonly known as:  ADVIL,MOTRIN     TAKE these medications   amLODipine 5 MG tablet Commonly known as:  NORVASC Take 5 mg by mouth daily.   aspirin 325 MG EC tablet Take 1 tablet (325 mg total) 2 (two) times daily after a meal by mouth.   atorvastatin 40 MG tablet Commonly known as:  LIPITOR Take 40 mg by mouth daily.   bisacodyl 5 MG EC tablet Commonly known as:  DULCOLAX Take 2 tablets (10 mg total) daily as needed by mouth for moderate constipation.   docusate sodium 100 MG capsule Commonly known as:  COLACE Take 1 capsule (100 mg total) 2 (two) times daily by mouth.   HYDROcodone-acetaminophen 7.5-325 MG tablet Commonly known as:  NORCO Take 1-2 tablets every 4 (four) hours as needed by mouth for moderate pain or severe pain.   losartan 50 MG tablet Commonly known as:  COZAAR Take 50 mg by  mouth daily.   tiZANidine 4 MG tablet Commonly known as:  ZANAFLEX Take 1 tablet (4 mg total) every 6 (six) hours as needed by mouth for muscle spasms.            Durable Medical Equipment  (From admission, onward)        Start     Ordered   10/23/17 1126  DME Walker rolling  Once    Question:  Patient needs a walker to treat with the following condition  Answer:  Primary osteoarthritis of left knee   10/23/17 1125   10/23/17 1126  DME 3 n 1  Once     10/23/17 1125   10/23/17 1126  DME Bedside commode  Once    Question:  Patient needs a bedside commode to treat with the  following condition  Answer:  Primary osteoarthritis of left knee   10/23/17 1125      Diagnostic Studies: Dg Chest 2 View  Result Date: 10/18/2017 CLINICAL DATA:  Preoperative examination prior total knee arthroplasty. History of COPD, hypertension, former smoker. EXAM: CHEST  2 VIEW COMPARISON:  Chest x-ray and chest CT scan of May 12, 2010 FINDINGS: The lungs are adequately inflated. There are stable bullous lesions peripherally in the right upper lobe. Linear density at the left lung base is compatible with atelectasis. The heart and pulmonary vascularity are normal. There is calcification in the wall of the aortic arch. There is multilevel degenerative disc disease of the thoracic spine. IMPRESSION: Chronic emphysematous changes.  No acute pneumonia nor CHF. Thoracic aortic atherosclerosis. Electronically Signed   By: David  Martinique M.D.   On: 10/18/2017 10:49    Disposition: Final discharge disposition not confirmed  Discharge Instructions    Call MD / Call 911   Complete by:  As directed    If you experience chest pain or shortness of breath, CALL 911 and be transported to the hospital emergency room.  If you develope a fever above 101 F, pus (white drainage) or increased drainage or redness at the wound, or calf pain, call your surgeon's office.   Constipation Prevention   Complete by:  As directed    Drink plenty of fluids.  Prune juice may be helpful.  You may use a stool softener, such as Colace (over the counter) 100 mg twice a day.  Use MiraLax (over the counter) for constipation as needed.   Diet - low sodium heart healthy   Complete by:  As directed    Discharge instructions   Complete by:  As directed    INSTRUCTIONS AFTER JOINT REPLACEMENT   Remove items at home which could result in a fall. This includes throw rugs or furniture in walking pathways ICE to the affected joint every three hours while awake for 30 minutes at a time, for at least the first 3-5 days, and then as  needed for pain and swelling.  Continue to use ice for pain and swelling. You may notice swelling that will progress down to the foot and ankle.  This is normal after surgery.  Elevate your leg when you are not up walking on it.   Continue to use the breathing machine you got in the hospital (incentive spirometer) which will help keep your temperature down.  It is common for your temperature to cycle up and down following surgery, especially at night when you are not up moving around and exerting yourself.  The breathing machine keeps your lungs expanded and your temperature down.  DIET:  As you were doing prior to hospitalization, we recommend a well-balanced diet.  DRESSING / WOUND CARE / SHOWERING  You may shower 3 days after surgery, but keep the wounds dry during showering.  You may use an occlusive plastic wrap (Press'n Seal for example), NO SOAKING/SUBMERGING IN THE BATHTUB.  If the bandage gets wet, change with a clean dry gauze.  If the incision gets wet, pat the wound dry with a clean towel.  ACTIVITY  Increase activity slowly as tolerated, but follow the weight bearing instructions below.   No driving for 6 weeks or until further direction given by your physician.  You cannot drive while taking narcotics.  No lifting or carrying greater than 10 lbs. until further directed by your surgeon. Avoid periods of inactivity such as sitting longer than an hour when not asleep. This helps prevent blood clots.  You may return to work once you are authorized by your doctor.     WEIGHT BEARING   Weight bearing as tolerated with assist device (walker, cane, etc) as directed, use it as long as suggested by your surgeon or therapist, typically at least 4-6 weeks.   EXERCISES  Results after joint replacement surgery are often greatly improved when you follow the exercise, range of motion and muscle strengthening exercises prescribed by your doctor. Safety measures are also important to protect  the joint from further injury. Any time any of these exercises cause you to have increased pain or swelling, decrease what you are doing until you are comfortable again and then slowly increase them. If you have problems or questions, call your caregiver or physical therapist for advice.   Rehabilitation is important following a joint replacement. After just a few days of immobilization, the muscles of the leg can become weakened and shrink (atrophy).  These exercises are designed to build up the tone and strength of the thigh and leg muscles and to improve motion. Often times heat used for twenty to thirty minutes before working out will loosen up your tissues and help with improving the range of motion but do not use heat for the first two weeks following surgery (sometimes heat can increase post-operative swelling).   These exercises can be done on a training (exercise) mat, on the floor, on a table or on a bed. Use whatever works the best and is most comfortable for you.    Use music or television while you are exercising so that the exercises are a pleasant break in your day. This will make your life better with the exercises acting as a break in your routine that you can look forward to.   Perform all exercises about fifteen times, three times per day or as directed.  You should exercise both the operative leg and the other leg as well.   Exercises include:   Quad Sets - Tighten up the muscle on the front of the thigh (Quad) and hold for 5-10 seconds.   Straight Leg Raises - With your knee straight (if you were given a brace, keep it on), lift the leg to 60 degrees, hold for 3 seconds, and slowly lower the leg.  Perform this exercise against resistance later as your leg gets stronger.  Leg Slides: Lying on your back, slowly slide your foot toward your buttocks, bending your knee up off the floor (only go as far as is comfortable). Then slowly slide your foot back down until your leg is flat on the  floor again.  [employer]  Wings: Lying on your back spread your legs to the side as far apart as you can without causing discomfort.  Hamstring Strength:  Lying on your back, push your heel against the floor with your leg straight by tightening up the muscles of your buttocks.  Repeat, but this time bend your knee to a comfortable angle, and push your heel against the floor.  You may put a pillow under the heel to make it more comfortable if necessary.   A rehabilitation program following joint replacement surgery can speed recovery and prevent re-injury in the future due to weakened muscles. Contact your doctor or a physical therapist for more information on knee rehabilitation.    CONSTIPATION  Constipation is defined medically as fewer than three stools per week and severe constipation as less than one stool per week.  Even if you have a regular bowel pattern at home, your normal regimen is likely to be disrupted due to multiple reasons following surgery.  Combination of anesthesia, postoperative narcotics, change in appetite and fluid intake all can affect your bowels.   YOU MUST use at least one of the following options; they are listed in order of increasing strength to get the job done.  They are all available over the counter, and you may need to use some, POSSIBLY even all of these options:    Drink plenty of fluids (prune juice may be helpful) and high fiber foods Colace 100 mg by mouth twice a day  Senokot for constipation as directed and as needed Dulcolax (bisacodyl), take with full glass of water  Miralax (polyethylene glycol) once or twice a day as needed.  If you have tried all these things and are unable to have a bowel movement in the first 3-4 days after surgery call either your surgeon or your primary doctor.    If you experience loose stools or diarrhea, hold the medications until you stool forms back up.  If your symptoms do not get better within 1 week or if they get worse, check  with your doctor.  If you experience "the worst abdominal pain ever" or develop nausea or vomiting, please contact the office immediately for further recommendations for treatment.   ITCHING:  If you experience itching with your medications, try taking only a single pain pill, or even half a pain pill at a time.  You can also use Benadryl over the counter for itching or also to help with sleep.   TED HOSE STOCKINGS:  Use stockings on both legs until for at least 2 weeks or as directed by physician office. They may be removed at night for sleeping.  MEDICATIONS:  See your medication summary on the "After Visit Summary" that nursing will review with you.  You may have some home medications which will be placed on hold until you complete the course of blood thinner medication.  It is important for you to complete the blood thinner medication as prescribed.  PRECAUTIONS:  If you experience chest pain or shortness of breath - call 911 immediately for transfer to the hospital emergency department.   If you develop a fever greater that 101 F, purulent drainage from wound, increased redness or drainage from wound, foul odor from the wound/dressing, or calf pain - CONTACT YOUR SURGEON.  FOLLOW-UP APPOINTMENTS:  If you do not already have a post-op appointment, please call the office for an appointment to be seen by your surgeon.  Guidelines for how soon to be seen are listed in your "After Visit Summary", but are typically between 1-4 weeks after surgery.  OTHER INSTRUCTIONS:   Knee Replacement:  Do not place pillow under knee, focus on keeping the knee straight while resting. CPM instructions: 0-90 degrees, 2 hours in the morning, 2 hours in the afternoon, and 2 hours in the evening. Place foam block, curve side up under heel at all times except when in CPM or when walking.  DO NOT modify, tear, cut, or change the foam block in any way.  MAKE SURE YOU:   Understand these instructions.  Get help right away if you are not doing well or get worse.    Thank you for letting us be a part of your medical care team.  It is a privilege we respect greatly.  We hope these instructions will help you stay on track for a fast and full recovery!   Increase activity slowly as tolerated   Complete by:  As directed       Follow-up Information    Melrose Nakayama, MD. Schedule an appointment as soon as possible for a visit in 2 week(s).   Specialty:  Orthopedic Surgery Contact information: [address] [place] [address] [phone]        [facility] Follow up.   Specialty:  [place] Why:  Elbert will call to arrange initial visit Contact information: [address] [place] [address] [phone]            Signed: Rich Fuchs 10/25/2017, 6:56 AM

## 2017-10-25 NOTE — Plan of Care (Signed)
  Adequate for Discharge Clinical Measurements: Will remain free from infection 10/25/2017 1627 - Adequate for Discharge by Governor Rooks, RN Activity: Risk for activity intolerance will decrease 10/25/2017 1627 - Adequate for Discharge by Governor Rooks, RN Nutrition: Adequate nutrition will be maintained 10/25/2017 1627 - Adequate for Discharge by Governor Rooks, RN Pain Managment: General experience of comfort will improve 10/25/2017 1627 - Adequate for Discharge by Governor Rooks, RN Pain Management: Pain level will decrease with appropriate interventions 10/25/2017 1627 - Adequate for Discharge by Governor Rooks, RN Skin Integrity: Signs of wound healing will improve 10/25/2017 1627 - Adequate for Discharge by Governor Rooks, RN

## 2017-10-25 NOTE — Progress Notes (Signed)
Physical Therapy Treatment Patient Details Name: Trevor Bird MRN: [id number] DOB: Jul 13, 1946 Today's Date: 10/25/2017    History of Present Illness Pt is a 71 y/o male s/p elective L TKA. PMH includes aortic aneurysm s/p repair, DM, HTN, COPD, and R eye surgery.     PT Comments    Pt making good progress overall, performs most of his HEP without physical assistance, but does have most difficulty with SLR. He reports he may have 'overdone it' with his activity and stretching yesterday as his pain was increased thereafter. His ROM is reflective of this with a loss of knee extension ROM since 1DA. Pt has been proactive with utilizing CPM to improve stiffness. AMB appears safe, no patent LOB noted, and pt follows cues for gait correction very well. Gait remains quite laborious, requiring a few standing rest breaks to 'catch breath' and gait speed remains slow (<0.108m/s).   Follow Up Recommendations  DC plan and follow up therapy as arranged by surgeon;Supervision for mobility/OOB     Equipment Recommendations  None recommended by PT    Recommendations for Other Services       Precautions / Restrictions Precautions Precautions: Knee Precaution Booklet Issued: Yes (comment) Required Braces or Orthoses: Knee Immobilizer - Left Knee Immobilizer - Left: Other (comment);On at all times Restrictions Weight Bearing Restrictions: Yes LLE Weight Bearing: Weight bearing as tolerated    Mobility  Bed Mobility Overal bed mobility: Modified Independent       Supine to sit: Modified independent (Device/Increase time)     General bed mobility comments: trialed from flat bed to simulate home environment.   Transfers Overall transfer level: Needs assistance Equipment used: Rolling walker (2 wheeled) Transfers: Sit to/from Stand Sit to Stand: Supervision         General transfer comment: safe use of RW noted. Minimal LLE contribution d/t KI  Ambulation/Gait Ambulation/Gait  assistance: Min guard Ambulation Distance (Feet): 130 Feet Assistive device: Rolling walker (2 wheeled) Gait Pattern/deviations: Step-through pattern Gait velocity: 0.20m/s  Gait velocity interpretation: <1.8 ft/sec, indicative of risk for recurrent falls General Gait Details: cues for step through gait and to reduce LLE abducted gait    Stairs Stairs: (performed yesterday; pt atests confidence )          Wheelchair Mobility    Modified Rankin (Stroke Patients Only)       Balance Overall balance assessment: Needs assistance;No apparent balance deficits (not formally assessed)         Standing balance support: During functional activity Standing balance-Leahy Scale: Fair Standing balance comment: Reliant on UE support for balance.                             Cognition Arousal/Alertness: Awake/alert Behavior During Therapy: WFL for tasks assessed/performed Overall Cognitive Status: Within Functional Limits for tasks assessed                                 General Comments: Talkative and social, godo folowing of VC for gait correction      Exercises Total Joint Exercises Ankle Circles/Pumps: AROM;Both;15 reps;Supine Quad Sets: AROM;Left;10 reps;Supine Short Arc Quad: Left;10 reps;AAROM;Supine Heel Slides: AROM;AAROM;Left;10 reps;Supine Hip ABduction/ADduction: AROM;Left;10 reps;Supine Straight Leg Raises: AROM;Left;10 reps(unable to straighten knee as well today) Goniometric ROM: Lt Knee flexion ROM:  27-87 degrees     General Comments  Pertinent Vitals/Pain Pain Assessment: No/denies pain(reports pain is well controlled d/t pain meds; some intermittent anterior burning during gait. ) Faces Pain Scale: Hurts a little bit Pain Location: L knee  Pain Descriptors / Indicators: Burning Pain Intervention(s): Limited activity within patient's tolerance;Monitored during session;Premedicated before session;Repositioned;Ice applied     Home Living                      Prior Function            PT Goals (current goals can now be found in the care plan section) Acute Rehab PT Goals Patient Stated Goal: to go home  PT Goal Formulation: With patient Time For Goal Achievement: 10/30/17 Potential to Achieve Goals: Good Progress towards PT goals: Progressing toward goals    Frequency    7X/week      PT Plan Current plan remains appropriate    Co-evaluation              AM-PAC PT "6 Clicks" Daily Activity  Outcome Measure  Difficulty turning over in bed (including adjusting bedclothes, sheets and blankets)?: A Little Difficulty moving from lying on back to sitting on the side of the bed? : A Little Difficulty sitting down on and standing up from a chair with arms (e.g., wheelchair, bedside commode, etc,.)?: A Little Help needed moving to and from a bed to chair (including a wheelchair)?: A Little Help needed walking in hospital room?: A Little Help needed climbing 3-5 steps with a railing? : A Little 6 Click Score: 18    End of Session Equipment Utilized During Treatment: Gait belt;Left knee immobilizer Activity Tolerance: Patient tolerated treatment well;No increased pain;Patient limited by fatigue(stoped to catch breath, VSS) Patient left: in chair;with call bell/phone within reach(in bone foam ) Nurse Communication: Mobility status PT Visit Diagnosis: Other abnormalities of gait and mobility (R26.89);Pain Pain - Right/Left: Left Pain - part of body: Knee     Time: 2947-6546 PT Time Calculation (min) (ACUTE ONLY): 33 min  Charges:  $Gait Training: 8-22 mins $Therapeutic Exercise: 8-22 mins                    G Codes:       10:24 AM, 11/20/2017 Etta Grandchild, PT, DPT [facility] Physical Therapist - [facility] [phone] (Pager)  [phone] (Mobile)  [phone] (Office)      Kevonte Vanecek C 11-20-17, 10:22 AM

## 2017-10-25 NOTE — Progress Notes (Signed)
Occupational Therapy Treatment Patient Details Name: STEFON RAMTHUN MRN: [id number] DOB: 03-28-1946 Today's Date: 10/25/2017    History of present illness Pt is a 71 y/o male s/p elective L TKA. PMH includes aortic aneurysm s/p repair, DM, HTN, COPD, and R eye surgery.    OT comments  Pt seen for ADL retraining session today with focus on pt education re: LB ADL's and tub transfers. Pt declined going to rehab gym for dry run as he was using CPM. Pt was issued handout for tub transfer using 3:1 and demonstration was performed as well. He plans to sponge bath initially and will have PRN family assist after d/c per his report.    Follow Up Recommendations  DC plan and follow up therapy as arranged by surgeon;Supervision - Intermittent    Equipment Recommendations  None recommended by OT(Pt states that he has 3:1, shower seat & grab bar at home)    Recommendations for Other Services      Precautions / Restrictions Precautions Precautions: Knee Required Braces or Orthoses: Knee Immobilizer - Left Knee Immobilizer - Left: Other (comment);On at all times(Until discontinued) Restrictions Weight Bearing Restrictions: Yes LLE Weight Bearing: Weight bearing as tolerated       Mobility Bed Mobility                  Transfers                      Balance                                           ADL either performed or assessed with clinical judgement   ADL Overall ADL's : Needs assistance/impaired                                       General ADL Comments: Pt was educated in tub transfers using shower chair or 3:1 in tub. Pt declined going to [place] today to practice but was open to demonstration in room and handout was issued and reviewed as well. Also discussed LB ADL's related to bathing and dressing sit to stand for safety. Discussed RW safety during functional mobility/transfers. Pt reports that he will sponge bathe  initially at home and son will assist PRN for all LB ADL's Re: Shower/tub transfer "I will when I can, I don't want to rush it". Pt declined OOB for grooming for toilet transfers this date stating that he preferred to rest and was on CPM despite OT verbal education that CPM could be removed and then reapplied.     Vision Baseline Vision/History: Wears glasses Patient Visual Report: No change from baseline     Perception     Praxis      Cognition Arousal/Alertness: Awake/alert Behavior During Therapy: WFL for tasks assessed/performed Overall Cognitive Status: Within Functional Limits for tasks assessed                                 General Comments: Pt very chatty throughout session. Slight impulsivity noted and required redirection to task at times. Feel this is likely baseline.         Exercises     Shoulder Instructions  General Comments      Pertinent Vitals/ Pain       Pain Assessment: Faces Faces Pain Scale: Hurts a little bit Pain Location: L knee  Pain Descriptors / Indicators: Aching Pain Intervention(s): Monitored during session;Premedicated before session  Home Living                                          Prior Functioning/Environment              Frequency  Min 2X/week        Progress Toward Goals  OT Goals(current goals can now be found in the care plan section)  Progress towards OT goals: Progressing toward goals  Acute Rehab OT Goals Patient Stated Goal: to go home   Plan Discharge plan remains appropriate    Co-evaluation                 AM-PAC PT "6 Clicks" Daily Activity     Outcome Measure   Help from another person eating meals?: None Help from another person taking care of personal grooming?: A Little Help from another person toileting, which includes using toliet, bedpan, or urinal?: A Little Help from another person bathing (including washing, rinsing, drying)?: A Little Help  from another person to put on and taking off regular upper body clothing?: A Little Help from another person to put on and taking off regular lower body clothing?: A Little 6 Click Score: 19    End of Session Equipment Utilized During Treatment: Other (comment)(Pt declined OOB activity secondary to using CPM despite verbal education that CPM could be reapplied. Pt agreeable to ADL demo, verbal education and handout issued/reviewed for ADL's/tub transfer) CPM Left Knee CPM Left Knee: On Left Knee Flexion (Degrees): 50 Left Knee Extension (Degrees): 0  OT Visit Diagnosis: Unsteadiness on feet (R26.81);Pain Pain - part of body: Knee   Activity Tolerance Patient tolerated treatment well   Patient Left in bed;in CPM;with call bell/phone within reach   Nurse Communication          Time: 8502-7741 OT Time Calculation (min): 18 min  Charges: OT General Charges $OT Visit: 1 Visit OT Treatments $Self Care/Home Management : 8-22 mins   Haylen Shelnutt Beth Dixon, OTR/L 10/25/2017, 8:45 AM

## 2017-10-26 DIAGNOSIS — E119 Type 2 diabetes mellitus without complications: Secondary | ICD-10-CM | POA: Diagnosis not present

## 2017-10-26 DIAGNOSIS — Z471 Aftercare following joint replacement surgery: Secondary | ICD-10-CM | POA: Diagnosis not present

## 2017-10-26 DIAGNOSIS — Z96652 Presence of left artificial knee joint: Secondary | ICD-10-CM | POA: Diagnosis not present

## 2017-10-26 DIAGNOSIS — I10 Essential (primary) hypertension: Secondary | ICD-10-CM | POA: Diagnosis not present

## 2017-10-26 DIAGNOSIS — Z87891 Personal history of nicotine dependence: Secondary | ICD-10-CM | POA: Diagnosis not present

## 2017-10-29 DIAGNOSIS — E119 Type 2 diabetes mellitus without complications: Secondary | ICD-10-CM | POA: Diagnosis not present

## 2017-10-29 DIAGNOSIS — I10 Essential (primary) hypertension: Secondary | ICD-10-CM | POA: Diagnosis not present

## 2017-10-29 DIAGNOSIS — Z96652 Presence of left artificial knee joint: Secondary | ICD-10-CM | POA: Diagnosis not present

## 2017-10-29 DIAGNOSIS — Z87891 Personal history of nicotine dependence: Secondary | ICD-10-CM | POA: Diagnosis not present

## 2017-10-29 DIAGNOSIS — Z471 Aftercare following joint replacement surgery: Secondary | ICD-10-CM | POA: Diagnosis not present

## 2017-10-30 DIAGNOSIS — E119 Type 2 diabetes mellitus without complications: Secondary | ICD-10-CM | POA: Diagnosis not present

## 2017-10-30 DIAGNOSIS — Z87891 Personal history of nicotine dependence: Secondary | ICD-10-CM | POA: Diagnosis not present

## 2017-10-30 DIAGNOSIS — Z96652 Presence of left artificial knee joint: Secondary | ICD-10-CM | POA: Diagnosis not present

## 2017-10-30 DIAGNOSIS — I10 Essential (primary) hypertension: Secondary | ICD-10-CM | POA: Diagnosis not present

## 2017-10-30 DIAGNOSIS — Z471 Aftercare following joint replacement surgery: Secondary | ICD-10-CM | POA: Diagnosis not present

## 2017-10-31 DIAGNOSIS — E119 Type 2 diabetes mellitus without complications: Secondary | ICD-10-CM | POA: Diagnosis not present

## 2017-10-31 DIAGNOSIS — Z87891 Personal history of nicotine dependence: Secondary | ICD-10-CM | POA: Diagnosis not present

## 2017-10-31 DIAGNOSIS — Z471 Aftercare following joint replacement surgery: Secondary | ICD-10-CM | POA: Diagnosis not present

## 2017-10-31 DIAGNOSIS — I10 Essential (primary) hypertension: Secondary | ICD-10-CM | POA: Diagnosis not present

## 2017-10-31 DIAGNOSIS — Z96652 Presence of left artificial knee joint: Secondary | ICD-10-CM | POA: Diagnosis not present

## 2017-11-02 DIAGNOSIS — Z87891 Personal history of nicotine dependence: Secondary | ICD-10-CM | POA: Diagnosis not present

## 2017-11-02 DIAGNOSIS — Z96652 Presence of left artificial knee joint: Secondary | ICD-10-CM | POA: Diagnosis not present

## 2017-11-02 DIAGNOSIS — Z471 Aftercare following joint replacement surgery: Secondary | ICD-10-CM | POA: Diagnosis not present

## 2017-11-02 DIAGNOSIS — I10 Essential (primary) hypertension: Secondary | ICD-10-CM | POA: Diagnosis not present

## 2017-11-02 DIAGNOSIS — E119 Type 2 diabetes mellitus without complications: Secondary | ICD-10-CM | POA: Diagnosis not present

## 2017-11-05 DIAGNOSIS — M25562 Pain in left knee: Secondary | ICD-10-CM | POA: Diagnosis not present

## 2017-11-07 DIAGNOSIS — M25562 Pain in left knee: Secondary | ICD-10-CM | POA: Diagnosis not present

## 2017-11-07 DIAGNOSIS — M25662 Stiffness of left knee, not elsewhere classified: Secondary | ICD-10-CM | POA: Diagnosis not present

## 2017-11-07 DIAGNOSIS — Z96652 Presence of left artificial knee joint: Secondary | ICD-10-CM | POA: Diagnosis not present

## 2017-11-12 DIAGNOSIS — Z96652 Presence of left artificial knee joint: Secondary | ICD-10-CM | POA: Diagnosis not present

## 2017-11-12 DIAGNOSIS — M25562 Pain in left knee: Secondary | ICD-10-CM | POA: Diagnosis not present

## 2017-11-12 DIAGNOSIS — M25662 Stiffness of left knee, not elsewhere classified: Secondary | ICD-10-CM | POA: Diagnosis not present

## 2017-11-14 ENCOUNTER — Ambulatory Visit (HOSPITAL_COMMUNITY)
Admission: RE | Admit: 2017-11-14 | Discharge: 2017-11-14 | Disposition: A | Discharge status: Home / Self Care | Payer: Medicare Other | Source: Ambulatory Visit | Attending: Orthopaedic Surgery | Admitting: Orthopaedic Surgery

## 2017-11-14 ENCOUNTER — Other Ambulatory Visit (HOSPITAL_COMMUNITY): Payer: Self-pay | Admitting: Orthopaedic Surgery

## 2017-11-14 DIAGNOSIS — M25662 Stiffness of left knee, not elsewhere classified: Secondary | ICD-10-CM | POA: Diagnosis not present

## 2017-11-14 DIAGNOSIS — M79662 Pain in left lower leg: Secondary | ICD-10-CM | POA: Insufficient documentation

## 2017-11-14 DIAGNOSIS — M79605 Pain in left leg: Secondary | ICD-10-CM

## 2017-11-14 DIAGNOSIS — M7989 Other specified soft tissue disorders: Secondary | ICD-10-CM | POA: Insufficient documentation

## 2017-11-14 DIAGNOSIS — Z96652 Presence of left artificial knee joint: Secondary | ICD-10-CM | POA: Insufficient documentation

## 2017-11-14 DIAGNOSIS — M25562 Pain in left knee: Secondary | ICD-10-CM | POA: Diagnosis not present

## 2017-11-14 NOTE — Progress Notes (Signed)
Left lower extremity venous duplex has been completed. Negative for DVT. Results were given to Carly at Dr. Jerald Kief office.  11/14/17 1:21 PM Trevor Bird RVT

## 2017-11-21 DIAGNOSIS — M25562 Pain in left knee: Secondary | ICD-10-CM | POA: Diagnosis not present

## 2017-11-21 DIAGNOSIS — Z96652 Presence of left artificial knee joint: Secondary | ICD-10-CM | POA: Diagnosis not present

## 2017-11-21 DIAGNOSIS — M25662 Stiffness of left knee, not elsewhere classified: Secondary | ICD-10-CM | POA: Diagnosis not present

## 2017-11-23 DIAGNOSIS — M25562 Pain in left knee: Secondary | ICD-10-CM | POA: Diagnosis not present

## 2017-11-23 DIAGNOSIS — Z471 Aftercare following joint replacement surgery: Secondary | ICD-10-CM | POA: Diagnosis not present

## 2017-11-23 DIAGNOSIS — Z96652 Presence of left artificial knee joint: Secondary | ICD-10-CM | POA: Diagnosis not present

## 2017-12-03 DIAGNOSIS — M25662 Stiffness of left knee, not elsewhere classified: Secondary | ICD-10-CM | POA: Diagnosis not present

## 2017-12-03 DIAGNOSIS — Z96652 Presence of left artificial knee joint: Secondary | ICD-10-CM | POA: Diagnosis not present

## 2017-12-03 DIAGNOSIS — M25562 Pain in left knee: Secondary | ICD-10-CM | POA: Diagnosis not present

## 2017-12-06 DIAGNOSIS — Z96652 Presence of left artificial knee joint: Secondary | ICD-10-CM | POA: Diagnosis not present

## 2017-12-06 DIAGNOSIS — M25662 Stiffness of left knee, not elsewhere classified: Secondary | ICD-10-CM | POA: Diagnosis not present

## 2017-12-06 DIAGNOSIS — M25562 Pain in left knee: Secondary | ICD-10-CM | POA: Diagnosis not present

## 2017-12-10 DIAGNOSIS — M25662 Stiffness of left knee, not elsewhere classified: Secondary | ICD-10-CM | POA: Diagnosis not present

## 2017-12-10 DIAGNOSIS — Z96652 Presence of left artificial knee joint: Secondary | ICD-10-CM | POA: Diagnosis not present

## 2017-12-10 DIAGNOSIS — M25562 Pain in left knee: Secondary | ICD-10-CM | POA: Diagnosis not present

## 2017-12-13 DIAGNOSIS — Z96652 Presence of left artificial knee joint: Secondary | ICD-10-CM | POA: Diagnosis not present

## 2017-12-13 DIAGNOSIS — M25662 Stiffness of left knee, not elsewhere classified: Secondary | ICD-10-CM | POA: Diagnosis not present

## 2017-12-13 DIAGNOSIS — M25562 Pain in left knee: Secondary | ICD-10-CM | POA: Diagnosis not present

## 2017-12-14 DIAGNOSIS — R82998 Other abnormal findings in urine: Secondary | ICD-10-CM | POA: Diagnosis not present

## 2017-12-14 DIAGNOSIS — E7849 Other hyperlipidemia: Secondary | ICD-10-CM | POA: Diagnosis not present

## 2017-12-14 DIAGNOSIS — E1151 Type 2 diabetes mellitus with diabetic peripheral angiopathy without gangrene: Secondary | ICD-10-CM | POA: Diagnosis not present

## 2017-12-14 DIAGNOSIS — I10 Essential (primary) hypertension: Secondary | ICD-10-CM | POA: Diagnosis not present

## 2017-12-14 DIAGNOSIS — Z125 Encounter for screening for malignant neoplasm of prostate: Secondary | ICD-10-CM | POA: Diagnosis not present

## 2017-12-17 DIAGNOSIS — Z96652 Presence of left artificial knee joint: Secondary | ICD-10-CM | POA: Diagnosis not present

## 2017-12-17 DIAGNOSIS — M25662 Stiffness of left knee, not elsewhere classified: Secondary | ICD-10-CM | POA: Diagnosis not present

## 2017-12-17 DIAGNOSIS — M25562 Pain in left knee: Secondary | ICD-10-CM | POA: Diagnosis not present

## 2017-12-19 DIAGNOSIS — L308 Other specified dermatitis: Secondary | ICD-10-CM | POA: Diagnosis not present

## 2017-12-19 DIAGNOSIS — Z8679 Personal history of other diseases of the circulatory system: Secondary | ICD-10-CM | POA: Diagnosis not present

## 2017-12-19 DIAGNOSIS — I10 Essential (primary) hypertension: Secondary | ICD-10-CM | POA: Diagnosis not present

## 2017-12-19 DIAGNOSIS — J449 Chronic obstructive pulmonary disease, unspecified: Secondary | ICD-10-CM | POA: Diagnosis not present

## 2017-12-19 DIAGNOSIS — Z1389 Encounter for screening for other disorder: Secondary | ICD-10-CM | POA: Diagnosis not present

## 2017-12-19 DIAGNOSIS — N5089 Other specified disorders of the male genital organs: Secondary | ICD-10-CM | POA: Diagnosis not present

## 2017-12-19 DIAGNOSIS — M1711 Unilateral primary osteoarthritis, right knee: Secondary | ICD-10-CM | POA: Diagnosis not present

## 2017-12-19 DIAGNOSIS — Z0000 Encounter for general adult medical examination without abnormal findings: Secondary | ICD-10-CM | POA: Diagnosis not present

## 2017-12-19 DIAGNOSIS — M1712 Unilateral primary osteoarthritis, left knee: Secondary | ICD-10-CM | POA: Diagnosis not present

## 2017-12-19 DIAGNOSIS — E1151 Type 2 diabetes mellitus with diabetic peripheral angiopathy without gangrene: Secondary | ICD-10-CM | POA: Diagnosis not present

## 2017-12-19 DIAGNOSIS — E7849 Other hyperlipidemia: Secondary | ICD-10-CM | POA: Diagnosis not present

## 2017-12-19 DIAGNOSIS — Z6831 Body mass index (BMI) 31.0-31.9, adult: Secondary | ICD-10-CM | POA: Diagnosis not present

## 2017-12-20 DIAGNOSIS — M25562 Pain in left knee: Secondary | ICD-10-CM | POA: Diagnosis not present

## 2017-12-20 DIAGNOSIS — M25662 Stiffness of left knee, not elsewhere classified: Secondary | ICD-10-CM | POA: Diagnosis not present

## 2017-12-20 DIAGNOSIS — Z1212 Encounter for screening for malignant neoplasm of rectum: Secondary | ICD-10-CM | POA: Diagnosis not present

## 2017-12-20 DIAGNOSIS — Z96652 Presence of left artificial knee joint: Secondary | ICD-10-CM | POA: Diagnosis not present

## 2017-12-24 DIAGNOSIS — Z96652 Presence of left artificial knee joint: Secondary | ICD-10-CM | POA: Diagnosis not present

## 2017-12-24 DIAGNOSIS — M25562 Pain in left knee: Secondary | ICD-10-CM | POA: Diagnosis not present

## 2017-12-24 DIAGNOSIS — M25662 Stiffness of left knee, not elsewhere classified: Secondary | ICD-10-CM | POA: Diagnosis not present

## 2017-12-26 DIAGNOSIS — M25662 Stiffness of left knee, not elsewhere classified: Secondary | ICD-10-CM | POA: Diagnosis not present

## 2017-12-26 DIAGNOSIS — M25562 Pain in left knee: Secondary | ICD-10-CM | POA: Diagnosis not present

## 2018-01-15 DIAGNOSIS — Z961 Presence of intraocular lens: Secondary | ICD-10-CM | POA: Diagnosis not present

## 2018-01-15 DIAGNOSIS — H0014 Chalazion left upper eyelid: Secondary | ICD-10-CM | POA: Diagnosis not present

## 2018-01-15 DIAGNOSIS — H43811 Vitreous degeneration, right eye: Secondary | ICD-10-CM | POA: Diagnosis not present

## 2018-01-15 DIAGNOSIS — H2512 Age-related nuclear cataract, left eye: Secondary | ICD-10-CM | POA: Diagnosis not present

## 2018-01-15 DIAGNOSIS — H40013 Open angle with borderline findings, low risk, bilateral: Secondary | ICD-10-CM | POA: Diagnosis not present

## 2018-01-15 DIAGNOSIS — H04123 Dry eye syndrome of bilateral lacrimal glands: Secondary | ICD-10-CM | POA: Diagnosis not present

## 2018-01-15 DIAGNOSIS — H02054 Trichiasis without entropian left upper eyelid: Secondary | ICD-10-CM | POA: Diagnosis not present

## 2018-01-23 DIAGNOSIS — Z96652 Presence of left artificial knee joint: Secondary | ICD-10-CM | POA: Diagnosis not present

## 2018-01-23 DIAGNOSIS — Z471 Aftercare following joint replacement surgery: Secondary | ICD-10-CM | POA: Diagnosis not present

## 2018-01-23 DIAGNOSIS — M25562 Pain in left knee: Secondary | ICD-10-CM | POA: Diagnosis not present

## 2018-05-17 DIAGNOSIS — E1151 Type 2 diabetes mellitus with diabetic peripheral angiopathy without gangrene: Secondary | ICD-10-CM | POA: Diagnosis not present

## 2018-05-17 DIAGNOSIS — Z6831 Body mass index (BMI) 31.0-31.9, adult: Secondary | ICD-10-CM | POA: Diagnosis not present

## 2018-05-17 DIAGNOSIS — Z8679 Personal history of other diseases of the circulatory system: Secondary | ICD-10-CM | POA: Diagnosis not present

## 2018-05-17 DIAGNOSIS — M1712 Unilateral primary osteoarthritis, left knee: Secondary | ICD-10-CM | POA: Diagnosis not present

## 2018-05-17 DIAGNOSIS — M1711 Unilateral primary osteoarthritis, right knee: Secondary | ICD-10-CM | POA: Diagnosis not present

## 2018-05-17 DIAGNOSIS — I10 Essential (primary) hypertension: Secondary | ICD-10-CM | POA: Diagnosis not present

## 2018-05-17 DIAGNOSIS — N50819 Testicular pain, unspecified: Secondary | ICD-10-CM | POA: Diagnosis not present

## 2018-05-17 DIAGNOSIS — L508 Other urticaria: Secondary | ICD-10-CM | POA: Diagnosis not present

## 2018-05-22 DIAGNOSIS — Z96652 Presence of left artificial knee joint: Secondary | ICD-10-CM | POA: Diagnosis not present

## 2018-05-22 DIAGNOSIS — Z09 Encounter for follow-up examination after completed treatment for conditions other than malignant neoplasm: Secondary | ICD-10-CM | POA: Diagnosis not present

## 2018-05-22 DIAGNOSIS — M25562 Pain in left knee: Secondary | ICD-10-CM | POA: Diagnosis not present

## 2018-07-23 DIAGNOSIS — E1151 Type 2 diabetes mellitus with diabetic peripheral angiopathy without gangrene: Secondary | ICD-10-CM | POA: Diagnosis not present

## 2018-07-23 DIAGNOSIS — I10 Essential (primary) hypertension: Secondary | ICD-10-CM | POA: Diagnosis not present

## 2018-07-23 DIAGNOSIS — N390 Urinary tract infection, site not specified: Secondary | ICD-10-CM | POA: Diagnosis not present

## 2018-07-23 DIAGNOSIS — Z6831 Body mass index (BMI) 31.0-31.9, adult: Secondary | ICD-10-CM | POA: Diagnosis not present

## 2018-07-23 DIAGNOSIS — N50819 Testicular pain, unspecified: Secondary | ICD-10-CM | POA: Diagnosis not present

## 2018-07-23 DIAGNOSIS — R8290 Unspecified abnormal findings in urine: Secondary | ICD-10-CM | POA: Diagnosis not present

## 2018-08-01 DIAGNOSIS — N430 Encysted hydrocele: Secondary | ICD-10-CM | POA: Diagnosis not present

## 2018-08-01 DIAGNOSIS — N453 Epididymo-orchitis: Secondary | ICD-10-CM | POA: Diagnosis not present

## 2018-09-06 DIAGNOSIS — N430 Encysted hydrocele: Secondary | ICD-10-CM | POA: Diagnosis not present

## 2018-09-20 ENCOUNTER — Other Ambulatory Visit: Payer: Self-pay | Admitting: Urology

## 2018-09-26 DIAGNOSIS — J449 Chronic obstructive pulmonary disease, unspecified: Secondary | ICD-10-CM | POA: Diagnosis not present

## 2018-09-26 DIAGNOSIS — Z23 Encounter for immunization: Secondary | ICD-10-CM | POA: Diagnosis not present

## 2018-09-26 DIAGNOSIS — L509 Urticaria, unspecified: Secondary | ICD-10-CM | POA: Diagnosis not present

## 2018-09-26 DIAGNOSIS — N509 Disorder of male genital organs, unspecified: Secondary | ICD-10-CM | POA: Diagnosis not present

## 2018-09-26 DIAGNOSIS — Z6832 Body mass index (BMI) 32.0-32.9, adult: Secondary | ICD-10-CM | POA: Diagnosis not present

## 2018-09-26 DIAGNOSIS — M179 Osteoarthritis of knee, unspecified: Secondary | ICD-10-CM | POA: Diagnosis not present

## 2018-09-26 DIAGNOSIS — I10 Essential (primary) hypertension: Secondary | ICD-10-CM | POA: Diagnosis not present

## 2018-09-26 DIAGNOSIS — E1151 Type 2 diabetes mellitus with diabetic peripheral angiopathy without gangrene: Secondary | ICD-10-CM | POA: Diagnosis not present

## 2018-09-26 DIAGNOSIS — Z8679 Personal history of other diseases of the circulatory system: Secondary | ICD-10-CM | POA: Diagnosis not present

## 2018-10-02 DIAGNOSIS — M1711 Unilateral primary osteoarthritis, right knee: Secondary | ICD-10-CM | POA: Diagnosis not present

## 2018-10-14 ENCOUNTER — Other Ambulatory Visit: Payer: Self-pay | Admitting: Urology

## 2018-10-14 DIAGNOSIS — R310 Gross hematuria: Secondary | ICD-10-CM | POA: Diagnosis not present

## 2018-10-14 DIAGNOSIS — N430 Encysted hydrocele: Secondary | ICD-10-CM | POA: Diagnosis not present

## 2018-10-18 DIAGNOSIS — R310 Gross hematuria: Secondary | ICD-10-CM | POA: Diagnosis not present

## 2018-10-18 DIAGNOSIS — N200 Calculus of kidney: Secondary | ICD-10-CM | POA: Diagnosis not present

## 2018-10-23 ENCOUNTER — Encounter (HOSPITAL_BASED_OUTPATIENT_CLINIC_OR_DEPARTMENT_OTHER): Payer: Self-pay

## 2018-10-23 ENCOUNTER — Other Ambulatory Visit: Payer: Self-pay

## 2018-10-23 DIAGNOSIS — M25511 Pain in right shoulder: Secondary | ICD-10-CM | POA: Diagnosis not present

## 2018-10-23 NOTE — Progress Notes (Signed)
Spoke with:  [facility] NPO:  After Midnight, no gum, candy, or mints   Arrival time:  1045 Labs: EKG, Istat 4 AM medications: Amlodipine, Atorvastatin Pre op orders: Yes Ride home:  Herbie [place] (son) pt will provide number day of surgery

## 2018-10-28 ENCOUNTER — Encounter (HOSPITAL_BASED_OUTPATIENT_CLINIC_OR_DEPARTMENT_OTHER)
Admission: RE | Disposition: A | Discharge status: Home / Self Care | Payer: Self-pay | Source: Ambulatory Visit | Attending: Urology

## 2018-10-28 ENCOUNTER — Ambulatory Visit (HOSPITAL_BASED_OUTPATIENT_CLINIC_OR_DEPARTMENT_OTHER)
Admission: RE | Admit: 2018-10-28 | Discharge: 2018-10-28 | Disposition: A | Discharge status: Home / Self Care | Payer: Medicare Other | Source: Ambulatory Visit | Attending: Urology | Admitting: Urology

## 2018-10-28 ENCOUNTER — Ambulatory Visit (HOSPITAL_BASED_OUTPATIENT_CLINIC_OR_DEPARTMENT_OTHER): Payer: Medicare Other | Admitting: Certified Registered"

## 2018-10-28 ENCOUNTER — Encounter (HOSPITAL_BASED_OUTPATIENT_CLINIC_OR_DEPARTMENT_OTHER): Payer: Self-pay | Admitting: Certified Registered"

## 2018-10-28 ENCOUNTER — Other Ambulatory Visit: Payer: Self-pay

## 2018-10-28 DIAGNOSIS — N433 Hydrocele, unspecified: Secondary | ICD-10-CM | POA: Insufficient documentation

## 2018-10-28 DIAGNOSIS — E119 Type 2 diabetes mellitus without complications: Secondary | ICD-10-CM | POA: Insufficient documentation

## 2018-10-28 DIAGNOSIS — I10 Essential (primary) hypertension: Secondary | ICD-10-CM | POA: Insufficient documentation

## 2018-10-28 DIAGNOSIS — K5790 Diverticulosis of intestine, part unspecified, without perforation or abscess without bleeding: Secondary | ICD-10-CM | POA: Diagnosis not present

## 2018-10-28 DIAGNOSIS — Z96652 Presence of left artificial knee joint: Secondary | ICD-10-CM | POA: Insufficient documentation

## 2018-10-28 DIAGNOSIS — N401 Enlarged prostate with lower urinary tract symptoms: Secondary | ICD-10-CM | POA: Insufficient documentation

## 2018-10-28 DIAGNOSIS — J449 Chronic obstructive pulmonary disease, unspecified: Secondary | ICD-10-CM | POA: Insufficient documentation

## 2018-10-28 DIAGNOSIS — K429 Umbilical hernia without obstruction or gangrene: Secondary | ICD-10-CM | POA: Insufficient documentation

## 2018-10-28 DIAGNOSIS — E785 Hyperlipidemia, unspecified: Secondary | ICD-10-CM | POA: Diagnosis not present

## 2018-10-28 DIAGNOSIS — Z87891 Personal history of nicotine dependence: Secondary | ICD-10-CM | POA: Insufficient documentation

## 2018-10-28 DIAGNOSIS — Z882 Allergy status to sulfonamides status: Secondary | ICD-10-CM | POA: Insufficient documentation

## 2018-10-28 DIAGNOSIS — R3914 Feeling of incomplete bladder emptying: Secondary | ICD-10-CM | POA: Insufficient documentation

## 2018-10-28 DIAGNOSIS — I517 Cardiomegaly: Secondary | ICD-10-CM | POA: Diagnosis not present

## 2018-10-28 DIAGNOSIS — N430 Encysted hydrocele: Secondary | ICD-10-CM | POA: Diagnosis not present

## 2018-10-28 DIAGNOSIS — Z86010 Personal history of colonic polyps: Secondary | ICD-10-CM | POA: Diagnosis not present

## 2018-10-28 DIAGNOSIS — M1990 Unspecified osteoarthritis, unspecified site: Secondary | ICD-10-CM | POA: Insufficient documentation

## 2018-10-28 HISTORY — DX: Pain in right shoulder: M25.511

## 2018-10-28 HISTORY — DX: Diverticulosis of intestine, part unspecified, without perforation or abscess without bleeding: K57.90

## 2018-10-28 HISTORY — DX: Hydrocele, unspecified: N43.3

## 2018-10-28 HISTORY — DX: Abdominal aortic aneurysm, ruptured: I71.3

## 2018-10-28 HISTORY — DX: Abdominal aortic aneurysm, ruptured, unspecified: I71.30

## 2018-10-28 HISTORY — DX: Personal history of colonic polyps: Z86.010

## 2018-10-28 HISTORY — DX: Atherosclerosis of aorta: I70.0

## 2018-10-28 HISTORY — PX: CYSTOSCOPY: SHX5120

## 2018-10-28 HISTORY — PX: HYDROCELE EXCISION: SHX482

## 2018-10-28 HISTORY — DX: Personal history of other diseases of the circulatory system: Z86.79

## 2018-10-28 HISTORY — DX: Personal history of colon polyps, unspecified: Z86.0100

## 2018-10-28 SURGERY — HYDROCELECTOMY
Anesthesia: General | Site: Scrotum | Laterality: Right

## 2018-10-28 MED ORDER — FENTANYL CITRATE (PF) 100 MCG/2ML IJ SOLN
25.0000 ug | INTRAMUSCULAR | Status: DC | PRN
  Filled 2018-10-28: qty 1

## 2018-10-28 MED ORDER — LACTATED RINGERS IV SOLN
INTRAVENOUS | Status: DC
  Administered 2018-10-28: 11:00:00 via INTRAVENOUS
  Filled 2018-10-28: qty 1000

## 2018-10-28 MED ORDER — EPHEDRINE SULFATE-NACL 50-0.9 MG/10ML-% IV SOSY
PREFILLED_SYRINGE | INTRAVENOUS | Status: DC | PRN
  Administered 2018-10-28: 10 mg via INTRAVENOUS

## 2018-10-28 MED ORDER — STERILE WATER FOR IRRIGATION IR SOLN
Status: DC | PRN
  Administered 2018-10-28: 1000 mL

## 2018-10-28 MED ORDER — LIDOCAINE 2% (20 MG/ML) 5 ML SYRINGE
INTRAMUSCULAR | Status: DC | PRN
  Administered 2018-10-28: 60 mg via INTRAVENOUS

## 2018-10-28 MED ORDER — SODIUM CHLORIDE 0.9 % IV SOLN
INTRAVENOUS | Status: AC | PRN
  Administered 2018-10-28: 1000 mL via INTRAMUSCULAR

## 2018-10-28 MED ORDER — CEFAZOLIN SODIUM-DEXTROSE 2-4 GM/100ML-% IV SOLN
2.0000 g | INTRAVENOUS | Status: AC
  Administered 2018-10-28: 2 g via INTRAVENOUS
  Filled 2018-10-28: qty 100

## 2018-10-28 MED ORDER — DEXAMETHASONE SODIUM PHOSPHATE 10 MG/ML IJ SOLN
INTRAMUSCULAR | Status: AC
  Filled 2018-10-28: qty 1

## 2018-10-28 MED ORDER — ONDANSETRON HCL 4 MG/2ML IJ SOLN
INTRAMUSCULAR | Status: AC
  Filled 2018-10-28: qty 2

## 2018-10-28 MED ORDER — PROPOFOL 10 MG/ML IV BOLUS
INTRAVENOUS | Status: DC | PRN
  Administered 2018-10-28: 150 mg via INTRAVENOUS

## 2018-10-28 MED ORDER — LIDOCAINE 2% (20 MG/ML) 5 ML SYRINGE
INTRAMUSCULAR | Status: AC
  Filled 2018-10-28: qty 5

## 2018-10-28 MED ORDER — DEXAMETHASONE SODIUM PHOSPHATE 10 MG/ML IJ SOLN
INTRAMUSCULAR | Status: DC | PRN
  Administered 2018-10-28: 10 mg via INTRAVENOUS

## 2018-10-28 MED ORDER — ONDANSETRON HCL 4 MG/2ML IJ SOLN
4.0000 mg | Freq: Once | INTRAMUSCULAR | Status: DC | PRN
  Filled 2018-10-28: qty 2

## 2018-10-28 MED ORDER — BUPIVACAINE HCL (PF) 0.25 % IJ SOLN
INTRAMUSCULAR | Status: DC | PRN
  Administered 2018-10-28: 10 mL

## 2018-10-28 MED ORDER — ONDANSETRON HCL 4 MG/2ML IJ SOLN
INTRAMUSCULAR | Status: DC | PRN
  Administered 2018-10-28: 4 mg via INTRAVENOUS

## 2018-10-28 MED ORDER — FENTANYL CITRATE (PF) 100 MCG/2ML IJ SOLN
INTRAMUSCULAR | Status: AC
  Filled 2018-10-28: qty 2

## 2018-10-28 MED ORDER — HYDROCODONE-ACETAMINOPHEN 5-325 MG PO TABS: 1.0000 | ORAL_TABLET | Freq: Four times a day (QID) | ORAL | 0 refills | Status: AC | PRN

## 2018-10-28 MED ORDER — PROPOFOL 10 MG/ML IV BOLUS
INTRAVENOUS | Status: AC
  Filled 2018-10-28: qty 20

## 2018-10-28 MED ORDER — FENTANYL CITRATE (PF) 100 MCG/2ML IJ SOLN
INTRAMUSCULAR | Status: DC | PRN
  Administered 2018-10-28: 50 ug via INTRAVENOUS
  Administered 2018-10-28 (×2): 25 ug via INTRAVENOUS

## 2018-10-28 MED ORDER — CEFAZOLIN SODIUM-DEXTROSE 2-4 GM/100ML-% IV SOLN
INTRAVENOUS | Status: AC
  Filled 2018-10-28: qty 100

## 2018-10-28 SURGICAL SUPPLY — 44 items
ADH SKN CLS APL DERMABOND .7 (GAUZE/BANDAGES/DRESSINGS) ×2
BLADE CLIPPER SENSICLIP SURGIC (BLADE) ×2 IMPLANT
BLADE SURG 15 STRL LF DISP TIS (BLADE) ×2 IMPLANT
BLADE SURG 15 STRL SS (BLADE) ×4
BNDG GAUZE ELAST 4 BULKY (GAUZE/BANDAGES/DRESSINGS) ×2 IMPLANT
CLOTH BEACON ORANGE TIMEOUT ST (SAFETY) ×4 IMPLANT
COVER BACK TABLE 60X90IN (DRAPES) ×4 IMPLANT
COVER MAYO STAND STRL (DRAPES) ×4 IMPLANT
COVER WAND RF STERILE (DRAPES) ×6 IMPLANT
DERMABOND ADVANCED (GAUZE/BANDAGES/DRESSINGS) ×2
DERMABOND ADVANCED .7 DNX12 (GAUZE/BANDAGES/DRESSINGS) ×2 IMPLANT
DRAIN PENROSE 18X1/2 LTX STRL (DRAIN) ×4 IMPLANT
DRAPE LAPAROTOMY 100X72 PEDS (DRAPES) ×4 IMPLANT
DRSG TEGADERM 4X4.75 (GAUZE/BANDAGES/DRESSINGS) IMPLANT
ELECT REM PT RETURN 9FT ADLT (ELECTROSURGICAL) ×4
ELECTRODE REM PT RTRN 9FT ADLT (ELECTROSURGICAL) ×2 IMPLANT
GAUZE SPONGE 4X4 12PLY STRL LF (GAUZE/BANDAGES/DRESSINGS) ×2 IMPLANT
GLOVE BIO SURGEON STRL SZ8 (GLOVE) ×4 IMPLANT
GOWN STRL REUS W/ TWL LRG LVL3 (GOWN DISPOSABLE) ×2 IMPLANT
GOWN STRL REUS W/ TWL XL LVL3 (GOWN DISPOSABLE) ×2 IMPLANT
GOWN STRL REUS W/TWL LRG LVL3 (GOWN DISPOSABLE) ×8
GOWN STRL REUS W/TWL XL LVL3 (GOWN DISPOSABLE) ×8 IMPLANT
KIT TURNOVER CYSTO (KITS) ×4 IMPLANT
NDL HYPO 25X1 1.5 SAFETY (NEEDLE) ×2 IMPLANT
NEEDLE HYPO 25X1 1.5 SAFETY (NEEDLE) IMPLANT
NS IRRIG 500ML POUR BTL (IV SOLUTION) IMPLANT
PACK BASIN DAY SURGERY FS (CUSTOM PROCEDURE TRAY) ×4 IMPLANT
PACK CYSTO (CUSTOM PROCEDURE TRAY) ×4 IMPLANT
PENCIL BUTTON HOLSTER BLD 10FT (ELECTRODE) ×4 IMPLANT
SUPPORT SCROTAL LG STRP (MISCELLANEOUS) ×3 IMPLANT
SUPPORTER ATHLETIC LG (MISCELLANEOUS) ×1
SUT ETHILON 4 0 PS 2 18 (SUTURE) ×4 IMPLANT
SUT MNCRL AB 4-0 PS2 18 (SUTURE) ×4 IMPLANT
SUT SILK 0 SH 30 (SUTURE) IMPLANT
SUT VIC AB 2-0 SH 27 (SUTURE) ×8
SUT VIC AB 2-0 SH 27XBRD (SUTURE) ×4 IMPLANT
SYR 20CC LL (SYRINGE) ×8 IMPLANT
SYR CONTROL 10ML LL (SYRINGE) ×4 IMPLANT
TOWEL OR 17X24 6PK STRL BLUE (TOWEL DISPOSABLE) ×8 IMPLANT
TRAY DSU PREP LF (CUSTOM PROCEDURE TRAY) ×4 IMPLANT
TUBE CONNECTING 12'X1/4 (SUCTIONS) ×1
TUBE CONNECTING 12X1/4 (SUCTIONS) ×3 IMPLANT
WATER STERILE IRR 1000ML POUR (IV SOLUTION) ×2 IMPLANT
YANKAUER SUCT BULB TIP NO VENT (SUCTIONS) ×4 IMPLANT

## 2018-10-28 NOTE — Anesthesia Preprocedure Evaluation (Addendum)
Anesthesia Evaluation  Patient identified by MRN, date of birth, ID band Patient awake    Reviewed: Allergy & Precautions, NPO status , Patient's Chart, lab work & pertinent test results  Airway Mallampati: II  TM Distance: >3 FB Neck ROM: Full    Dental no notable dental hx.    Pulmonary COPD, former smoker,    Pulmonary exam normal breath sounds clear to auscultation       Cardiovascular hypertension, Pt. on medications Normal cardiovascular exam Rhythm:Regular Rate:Normal  ECG: NSR, rate 87   Neuro/Psych  Headaches, negative psych ROS   GI/Hepatic negative GI ROS, Neg liver ROS,   Endo/Other  diabetes  Renal/GU negative Renal ROS     Musculoskeletal negative musculoskeletal ROS (+)   Abdominal   Peds  Hematology HLD   Anesthesia Other Findings RIGHT HYDROCELE  Reproductive/Obstetrics                           Anesthesia Physical Anesthesia Plan  ASA: II  Anesthesia Plan: General   Post-op Pain Management:    Induction: Intravenous  PONV Risk Score and Plan: 2 and Ondansetron, Dexamethasone and Treatment may vary due to age or medical condition  Airway Management Planned: LMA  Additional Equipment:   Intra-op Plan:   Post-operative Plan: Extubation in OR  Informed Consent: I have reviewed the patients History and Physical, chart, labs and discussed the procedure including the risks, benefits and alternatives for the proposed anesthesia with the patient or authorized representative who has indicated his/her understanding and acceptance.   Dental advisory given  Plan Discussed with: CRNA  Anesthesia Plan Comments:         Anesthesia Quick Evaluation

## 2018-10-28 NOTE — Anesthesia Procedure Notes (Signed)
Procedure Name: LMA Insertion Date/Time: 10/28/2018 12:30 PM Performed by: Suan Halter, CRNA Pre-anesthesia Checklist: Patient identified, Emergency Drugs available, Suction available and Patient being monitored Patient Re-evaluated:Patient Re-evaluated prior to induction Oxygen Delivery Method: Circle system utilized Preoxygenation: Pre-oxygenation with 100% oxygen Induction Type: IV induction Ventilation: Mask ventilation without difficulty LMA: LMA inserted LMA Size: 4.0 Number of attempts: 1 Airway Equipment and Method: Bite block Placement Confirmation: positive ETCO2 Tube secured with: Tape Dental Injury: Teeth and Oropharynx as per pre-operative assessment

## 2018-10-28 NOTE — Discharge Instructions (Signed)
1.  Activity:  You are encouraged to ambulate frequently (about every hour during waking hours) to help prevent blood clots from forming in your legs or lungs.  However, you should not engage in any heavy lifting (> 10-15 lbs), strenuous activity, or straining. 2. Diet: You should advance your diet as instructed by your physician.  It will be normal to have some bloating, nausea, and abdominal discomfort intermittently. 3. Prescriptions:  You will be provided a prescription for pain medication to take as needed.  If your pain is not severe enough to require the prescription pain medication, you may take extra strength Tylenol instead which will have less side effects.  You should also take a prescribed stool softener to avoid straining with bowel movements as the prescription pain medication may constipate you.  4. Incisions: You may remove your dressing bandages 48 hours after surgery if not removed in the hospital.  You will either have some small staples or special tissue glue at each of the incision sites. Once the bandages are removed (if present), the incisions may stay open to air.  You may start showering (but not soaking or bathing in water) the 2nd day after surgery and the incisions simply need to be patted dry after the shower.  No additional care is needed. 5. What to call us about: You should call the office [phone]) if you develop fever > 101 or develop persistent vomiting.  YOU MAY RESUME YOUR ASPIRIN IN 1 WEEK AFTER SURGERY.    Post Anesthesia Home Care Instructions  Activity: Get plenty of rest for the remainder of the day. A responsible adult should stay with you for 24 hours following the procedure.  For the next 24 hours, DO NOT: -Drive a car -Paediatric nurse -Drink alcoholic beverages -Take any medication unless instructed by your physician -Make any legal decisions or sign important papers.  Meals: Start with liquid foods such as gelatin or soup. Progress to  regular foods as tolerated. Avoid greasy, spicy, heavy foods. If nausea and/or vomiting occur, drink only clear liquids until the nausea and/or vomiting subsides. Call your physician if vomiting continues.  Special Instructions/Symptoms: Your throat may feel dry or sore from the anesthesia or the breathing tube placed in your throat during surgery. If this causes discomfort, gargle with warm salt water. The discomfort should disappear within 24 hours.  If you had a scopolamine patch placed behind your ear for the management of post- operative nausea and/or vomiting:  1. The medication in the patch is effective for 72 hours, after which it should be removed.  Wrap patch in a tissue and discard in the trash. Wash hands thoroughly with soap and water. 2. You may remove the patch earlier than 72 hours if you experience unpleasant side effects which may include dry mouth, dizziness or visual disturbances. 3. Avoid touching the patch. Wash your hands with soap and water after contact with the patch.

## 2018-10-28 NOTE — Transfer of Care (Signed)
Immediate Anesthesia Transfer of Care Note  Patient: Trevor Bird  Procedure(s) Performed: Procedure(s) (LRB): HYDROCELECTOMY ADULT (Right) CYSTOSCOPY (N/A)  Patient Location: PACU  Anesthesia Type: General  Level of Consciousness: awake, oriented, sedated and patient cooperative  Airway & Oxygen Therapy: Patient Spontanous Breathing and Patient connected to face mask oxygen  Post-op Assessment: Report given to PACU RN and Post -op Vital signs reviewed and stable  Post vital signs: Reviewed and stable  Complications: No apparent anesthesia complications  Last Vitals:  Vitals Value Taken Time  BP    Temp    Pulse 81 10/28/2018  1:28 PM  Resp    SpO2 94 % 10/28/2018  1:28 PM  Vitals shown include unvalidated device data.  Last Pain:  Vitals:   10/28/18 1051  TempSrc:   PainSc: 0-No pain      Patients Stated Pain Goal: 6 (10/28/18 1051)

## 2018-10-28 NOTE — Op Note (Signed)
Preoperative diagnosis: Right Hydrocele, BPH with LUTS, incomplete emptying  Postoperative diagnosis: Same  Procedure: 1. Excision of right appendix testis 2. Right hydrocelectomy 3. cystoscopy  Attending: Nicolette Bang, MD  Resident: Andee Poles, MD  Anesthesia: General  History of blood loss: Minimal  Antibiotics: ancef  Drains: none  Specimens: 1. Right hydrocele sac   Findings: 5cm hydrocele. Bilobar hyperplasia with no obstructing median lobe. No masses/lesion in the bladder. No urethral strictures. Mild bladder trabeculations  Indications: Patient is a 72 year old male with a history of right hydrocele that was growing in size and causing him pain with walking.  We discussed the treatment options including observation versus excision after discussing treatment options he proceed with excision and cystoscopy to evaluate his prostate for future BPH therapy.   Procedure in detail: Prior to procedure consent was obtained.  Patient was brought to the operating room and a brief timeout was done to ensure correct patient, correct procedure, correct site.  General anesthesia was administered and patient was placed in supine position.  His genitalia was then prepped and draped in usual sterile fashion. Using flexible cystoscope we performed cystoscopy and the findings are noted above. A 3 cm incision was made in the right hemiscrotum.  We dissected down to the tunica and then incised the tunica. A large hydrocele was encountered and was drained. We then excised the hydrocele sac and then over sewed the edge with 2-0 Vicryl in a running fashion. We then excised the right appendix testis. Hemostasis was then obtained with electrocautery. We then closed the defect in the epididymis with 3-0 vicryl in a running fashion. We then returned the testis to the left hemiscrotum and closed the overlying dartos with 3-0 vicryl in a running fashion. The skin was then closed with 4-0 monocryl in a  running fashion. Dermabond was placed on the incision.  A dressing was then applied to the incision.  We then placed a scrotal fluff and this then concluded the procedure which was well tolerated by the patient.  Complications: None  Condition: Stable, extubated, transferred to PACU.  Plan: Patient is to be discharged home.  He is to follow up in 2 weeks for wound check.

## 2018-10-28 NOTE — Anesthesia Postprocedure Evaluation (Signed)
Anesthesia Post Note  Patient: Trevor Bird  Procedure(s) Performed: HYDROCELECTOMY ADULT (Right Scrotum) CYSTOSCOPY (N/A Bladder)     Patient location during evaluation: PACU Anesthesia Type: General Level of consciousness: awake and alert Pain management: pain level controlled Vital Signs Assessment: post-procedure vital signs reviewed and stable Respiratory status: spontaneous breathing, nonlabored ventilation, respiratory function stable and patient connected to nasal cannula oxygen Cardiovascular status: blood pressure returned to baseline and stable Postop Assessment: no apparent nausea or vomiting Anesthetic complications: no    Last Vitals:  Vitals:   10/28/18 1400 10/28/18 1430  BP: 127/76 126/73  Pulse: 77 80  Resp: 16 16  Temp:    SpO2: 92% 92%    Last Pain:  Vitals:   10/28/18 1430  TempSrc:   PainSc: 0-No pain                 [name]

## 2018-10-28 NOTE — H&P (Signed)
Urology Admission H&P  Chief Complaint: right scrotal swelling   History of Present Illness: Mr Trevor Bird is a 72yo with a hx of right hydrocele which has been growing in size and causing difficulty with urination and ambulation. He has moderate intermittent right testis pain which is worse with moveemnt  Past Medical History:  Diagnosis Date  . Arthritis   . COPD (chronic obstructive pulmonary disease) ([place])    was diagnosed but not anymore since he stopped smoking per patient  . Diabetes mellitus without complication ([place])    diet controlled, patient denies  . Diverticulosis 02/05/2017   Left colon, noted on colonscopy  . Headache    rare  . History of cardiomegaly 01/01/2011   Noted CT chest  . History of colon polyps   . Hydrocele, right   . Hyperlipidemia   . Hypertension   . Ruptured abdominal aortic aneurysm (AAA) ([place])   . Shoulder pain, right    right shoulder injection 10/23/2018  . Thoracic aorta atherosclerosis ([place]) 10/18/2017   Noted CXR  . Umbilical hernia    Past Surgical History:  Procedure Laterality Date  . ABDOMINAL AORTIC ANEURYSM REPAIR    . COLONOSCOPY W/ POLYPECTOMY  02/05/2017  . EYE SURGERY     right eye has a lens  . FINGER SURGERY     Right middle finger after accident with tire changing machine  . TONSILLECTOMY    . TOTAL KNEE ARTHROPLASTY Left 10/23/2017   Procedure: TOTAL KNEE ARTHROPLASTY;  Surgeon: Melrose Nakayama, MD;  Location: [facility];  Service: Orthopedics;  Laterality: Left;  . UPPER GI ENDOSCOPY  1995    Home Medications:  Current Facility-Administered Medications  Medication Dose Route Frequency Provider Last Rate Last Dose  . ceFAZolin (ANCEF) IVPB 2g/100 mL premix  2 g Intravenous 30 min Pre-Op Jazzmyne Rasnick, Candee Furbish, MD      . lactated ringers infusion   Intravenous Continuous Ellender, Karyl Kinnier, MD 50 mL/hr at 10/28/18 1120     Allergies:  Allergies  Allergen Reactions  . Sulfa Antibiotics Itching    Family History  Problem  Relation Age of Onset  . Colon cancer Neg Hx   . Esophageal cancer Neg Hx   . Pancreatic cancer Neg Hx   . Prostate cancer Neg Hx   . Rectal cancer Neg Hx   . Stomach cancer Neg Hx    Social History:  reports that he quit smoking about 6 years ago. His smoking use included cigarettes. He quit smokeless tobacco use about 6 years ago. He reports that he drinks alcohol. He reports that he does not use drugs.  Review of Systems  All other systems reviewed and are negative.   Physical Exam:  Vital signs in last 24 hours: Temp:  [97.5 F (36.4 C)] 97.5 F (36.4 C) (11/18 1020) Pulse Rate:  [91] 91 (11/18 1020) Resp:  [18] 18 (11/18 1020) BP: (108)/(74) 108/74 (11/18 1020) SpO2:  [98 %] 98 % (11/18 1020) Weight:  [98.9 kg] 98.9 kg (11/18 1020) Physical Exam  Constitutional: He is oriented to person, place, and time. He appears well-developed and well-nourished.  HENT:  Head: Normocephalic and atraumatic.  Eyes: Pupils are equal, round, and reactive to light. EOM are normal.  Neck: Normal range of motion. No thyromegaly present.  Cardiovascular: Normal rate and regular rhythm.  Respiratory: Effort normal. No respiratory distress.  GI: Soft. He exhibits no distension.  Musculoskeletal: Normal range of motion. He exhibits no edema.  Neurological: He is alert  and oriented to person, place, and time.  Skin: Skin is warm and dry.  Psychiatric: He has a normal mood and affect. His behavior is normal. Judgment and thought content normal.    Laboratory Data:  No results found for this or any previous visit (from the past 24 hour(s)). No results found for this or any previous visit (from the past 240 hour(s)). Creatinine: No results for input(s): CREATININE in the last 168 hours. Baseline Creatinine: unknown  Impression/Assessment:  72yo with a right hydrocele  Plan:  The risks/benefits/alternatives to right hydrocelectomy was explained to the patient and he understands and wishes to  proceed with surgery  Nicolette Bang 10/28/2018, 12:03 PM

## 2018-10-29 ENCOUNTER — Encounter (HOSPITAL_BASED_OUTPATIENT_CLINIC_OR_DEPARTMENT_OTHER): Payer: Self-pay | Admitting: Urology

## 2018-11-06 DIAGNOSIS — M25511 Pain in right shoulder: Secondary | ICD-10-CM | POA: Diagnosis not present

## 2018-11-11 DIAGNOSIS — R310 Gross hematuria: Secondary | ICD-10-CM | POA: Diagnosis not present

## 2018-11-11 DIAGNOSIS — N430 Encysted hydrocele: Secondary | ICD-10-CM | POA: Diagnosis not present

## 2019-01-08 DIAGNOSIS — E1151 Type 2 diabetes mellitus with diabetic peripheral angiopathy without gangrene: Secondary | ICD-10-CM | POA: Diagnosis not present

## 2019-01-08 DIAGNOSIS — I10 Essential (primary) hypertension: Secondary | ICD-10-CM | POA: Diagnosis not present

## 2019-01-08 DIAGNOSIS — E7849 Other hyperlipidemia: Secondary | ICD-10-CM | POA: Diagnosis not present

## 2019-01-08 DIAGNOSIS — Z125 Encounter for screening for malignant neoplasm of prostate: Secondary | ICD-10-CM | POA: Diagnosis not present

## 2019-01-08 DIAGNOSIS — R82998 Other abnormal findings in urine: Secondary | ICD-10-CM | POA: Diagnosis not present

## 2019-01-13 DIAGNOSIS — Z1212 Encounter for screening for malignant neoplasm of rectum: Secondary | ICD-10-CM | POA: Diagnosis not present

## 2019-01-15 DIAGNOSIS — N390 Urinary tract infection, site not specified: Secondary | ICD-10-CM | POA: Diagnosis not present

## 2019-01-15 DIAGNOSIS — Z0000 Encounter for general adult medical examination without abnormal findings: Secondary | ICD-10-CM | POA: Diagnosis not present

## 2019-01-15 DIAGNOSIS — E1151 Type 2 diabetes mellitus with diabetic peripheral angiopathy without gangrene: Secondary | ICD-10-CM | POA: Diagnosis not present

## 2019-01-15 DIAGNOSIS — I10 Essential (primary) hypertension: Secondary | ICD-10-CM | POA: Diagnosis not present

## 2019-01-15 DIAGNOSIS — Z1331 Encounter for screening for depression: Secondary | ICD-10-CM | POA: Diagnosis not present

## 2019-01-15 DIAGNOSIS — J449 Chronic obstructive pulmonary disease, unspecified: Secondary | ICD-10-CM | POA: Diagnosis not present

## 2019-01-15 DIAGNOSIS — Z8679 Personal history of other diseases of the circulatory system: Secondary | ICD-10-CM | POA: Diagnosis not present

## 2019-01-15 DIAGNOSIS — Z6831 Body mass index (BMI) 31.0-31.9, adult: Secondary | ICD-10-CM | POA: Diagnosis not present

## 2019-01-15 DIAGNOSIS — E7849 Other hyperlipidemia: Secondary | ICD-10-CM | POA: Diagnosis not present

## 2019-01-15 DIAGNOSIS — N509 Disorder of male genital organs, unspecified: Secondary | ICD-10-CM | POA: Diagnosis not present

## 2019-01-15 DIAGNOSIS — M179 Osteoarthritis of knee, unspecified: Secondary | ICD-10-CM | POA: Diagnosis not present

## 2019-01-15 DIAGNOSIS — D126 Benign neoplasm of colon, unspecified: Secondary | ICD-10-CM | POA: Diagnosis not present

## 2019-01-30 DIAGNOSIS — M25511 Pain in right shoulder: Secondary | ICD-10-CM | POA: Diagnosis not present

## 2019-01-30 DIAGNOSIS — S43101A Unspecified dislocation of right acromioclavicular joint, initial encounter: Secondary | ICD-10-CM | POA: Diagnosis not present

## 2019-02-10 DIAGNOSIS — H40013 Open angle with borderline findings, low risk, bilateral: Secondary | ICD-10-CM | POA: Diagnosis not present

## 2019-02-10 DIAGNOSIS — H43811 Vitreous degeneration, right eye: Secondary | ICD-10-CM | POA: Diagnosis not present

## 2019-02-10 DIAGNOSIS — Z961 Presence of intraocular lens: Secondary | ICD-10-CM | POA: Diagnosis not present

## 2019-02-10 DIAGNOSIS — H2512 Age-related nuclear cataract, left eye: Secondary | ICD-10-CM | POA: Diagnosis not present

## 2019-02-10 DIAGNOSIS — H04123 Dry eye syndrome of bilateral lacrimal glands: Secondary | ICD-10-CM | POA: Diagnosis not present

## 2019-02-10 DIAGNOSIS — H0014 Chalazion left upper eyelid: Secondary | ICD-10-CM | POA: Diagnosis not present

## 2019-02-10 DIAGNOSIS — H02054 Trichiasis without entropian left upper eyelid: Secondary | ICD-10-CM | POA: Diagnosis not present

## 2019-02-10 DIAGNOSIS — H26491 Other secondary cataract, right eye: Secondary | ICD-10-CM | POA: Diagnosis not present

## 2019-02-11 DIAGNOSIS — N430 Encysted hydrocele: Secondary | ICD-10-CM | POA: Diagnosis not present

## 2019-02-11 DIAGNOSIS — R310 Gross hematuria: Secondary | ICD-10-CM | POA: Diagnosis not present

## 2019-03-25 DIAGNOSIS — N452 Orchitis: Secondary | ICD-10-CM | POA: Diagnosis not present

## 2019-03-25 DIAGNOSIS — R310 Gross hematuria: Secondary | ICD-10-CM | POA: Diagnosis not present

## 2019-04-22 DIAGNOSIS — E1151 Type 2 diabetes mellitus with diabetic peripheral angiopathy without gangrene: Secondary | ICD-10-CM | POA: Diagnosis not present

## 2019-04-22 DIAGNOSIS — M179 Osteoarthritis of knee, unspecified: Secondary | ICD-10-CM | POA: Diagnosis not present

## 2019-04-22 DIAGNOSIS — J449 Chronic obstructive pulmonary disease, unspecified: Secondary | ICD-10-CM | POA: Diagnosis not present

## 2019-04-22 DIAGNOSIS — I10 Essential (primary) hypertension: Secondary | ICD-10-CM | POA: Diagnosis not present

## 2019-04-23 DIAGNOSIS — E1151 Type 2 diabetes mellitus with diabetic peripheral angiopathy without gangrene: Secondary | ICD-10-CM | POA: Diagnosis not present

## 2019-08-29 DIAGNOSIS — J449 Chronic obstructive pulmonary disease, unspecified: Secondary | ICD-10-CM | POA: Diagnosis not present

## 2019-08-29 DIAGNOSIS — M179 Osteoarthritis of knee, unspecified: Secondary | ICD-10-CM | POA: Diagnosis not present

## 2019-08-29 DIAGNOSIS — E785 Hyperlipidemia, unspecified: Secondary | ICD-10-CM | POA: Diagnosis not present

## 2019-08-29 DIAGNOSIS — E1151 Type 2 diabetes mellitus with diabetic peripheral angiopathy without gangrene: Secondary | ICD-10-CM | POA: Diagnosis not present

## 2019-08-29 DIAGNOSIS — Z8679 Personal history of other diseases of the circulatory system: Secondary | ICD-10-CM | POA: Diagnosis not present

## 2019-08-29 DIAGNOSIS — I10 Essential (primary) hypertension: Secondary | ICD-10-CM | POA: Diagnosis not present

## 2019-09-03 DIAGNOSIS — E1151 Type 2 diabetes mellitus with diabetic peripheral angiopathy without gangrene: Secondary | ICD-10-CM | POA: Diagnosis not present

## 2019-10-17 IMAGING — CR DG CHEST 2V
2 series · 2 of 2 positions shown · non-contrast
Comparison: Chest x-ray and chest CT scan May 12, 2010

CLINICAL DATA: Preoperative examination prior total knee
arthroplasty. History of COPD, hypertension, former smoker.

EXAM:
CHEST  2 VIEW

[w chest pa]
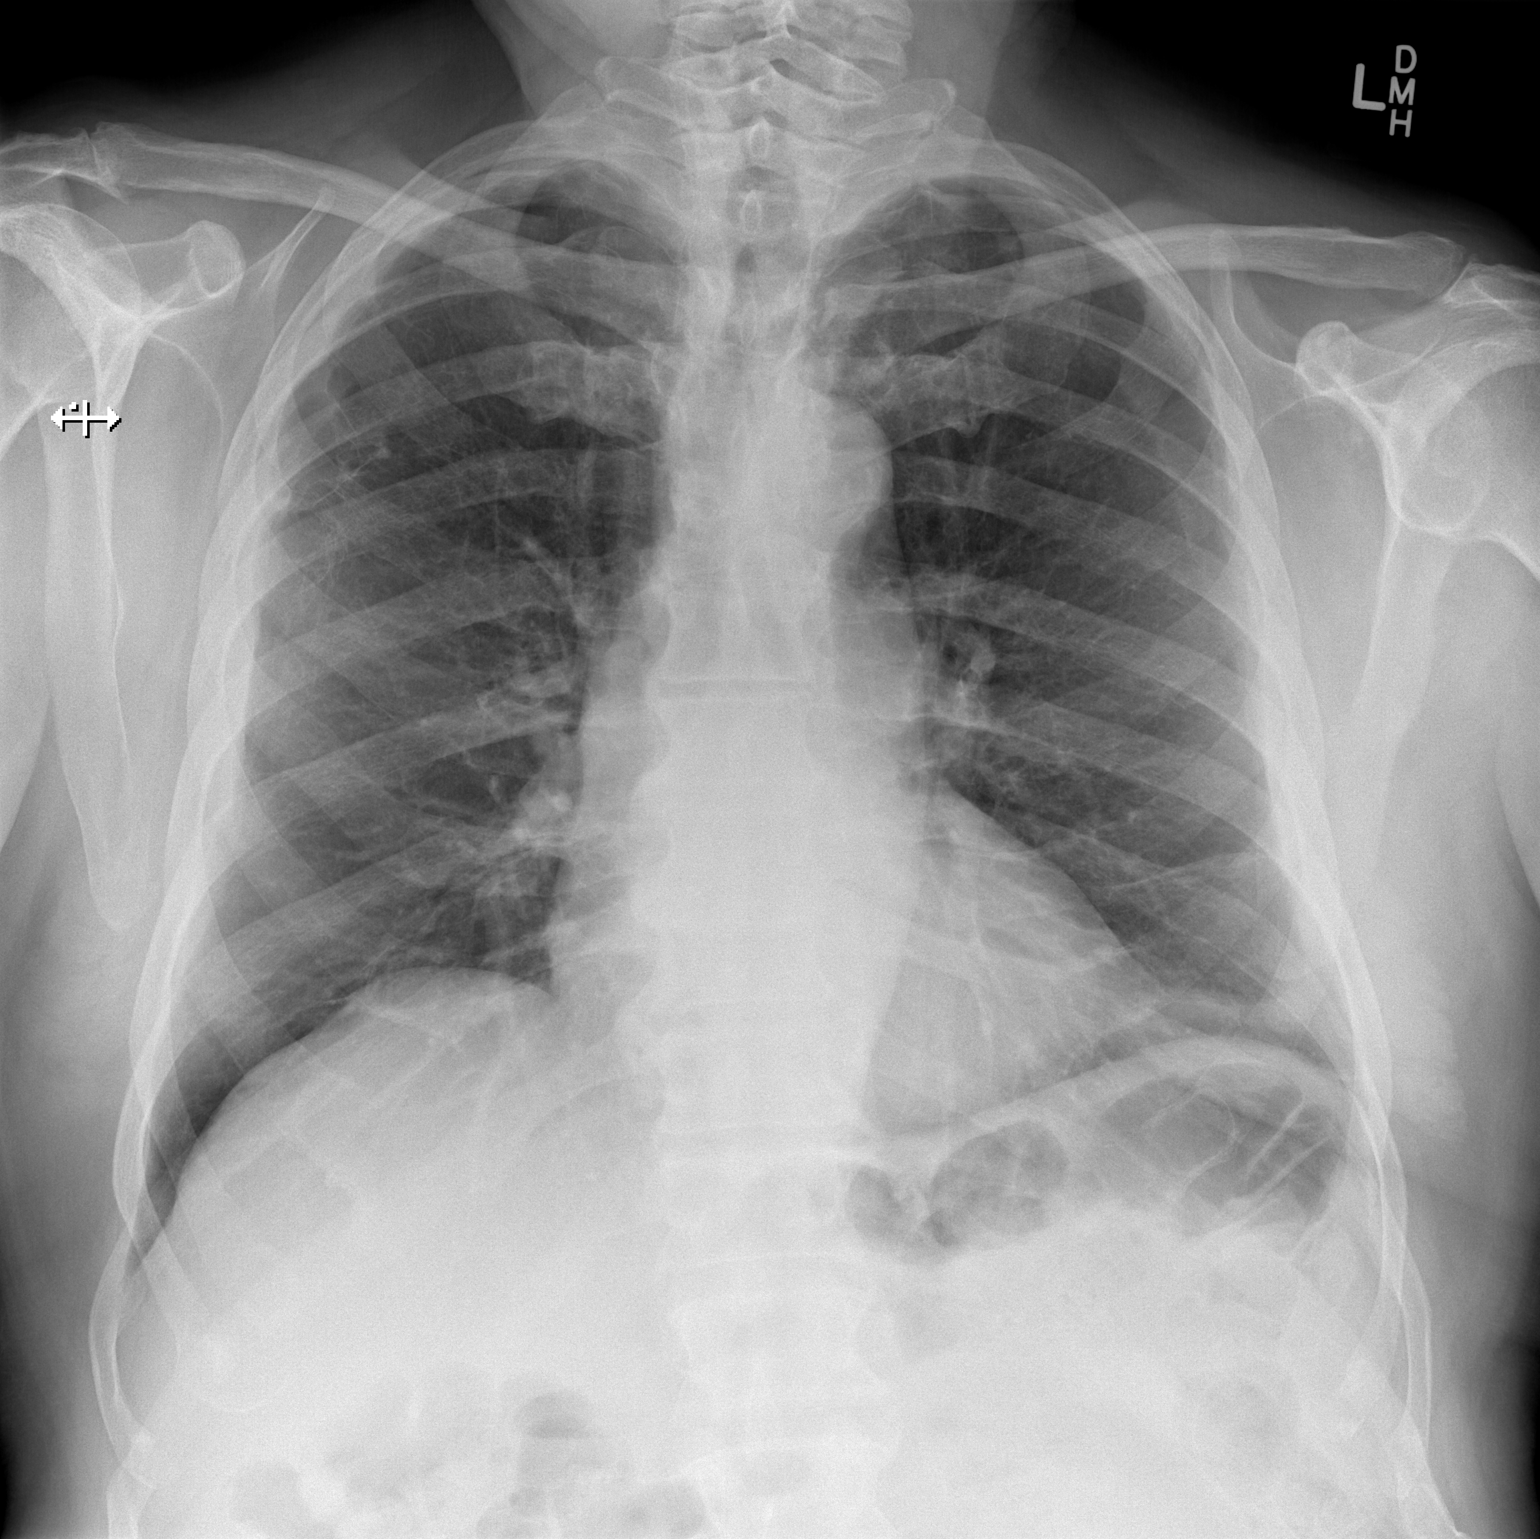

[w chest lat]
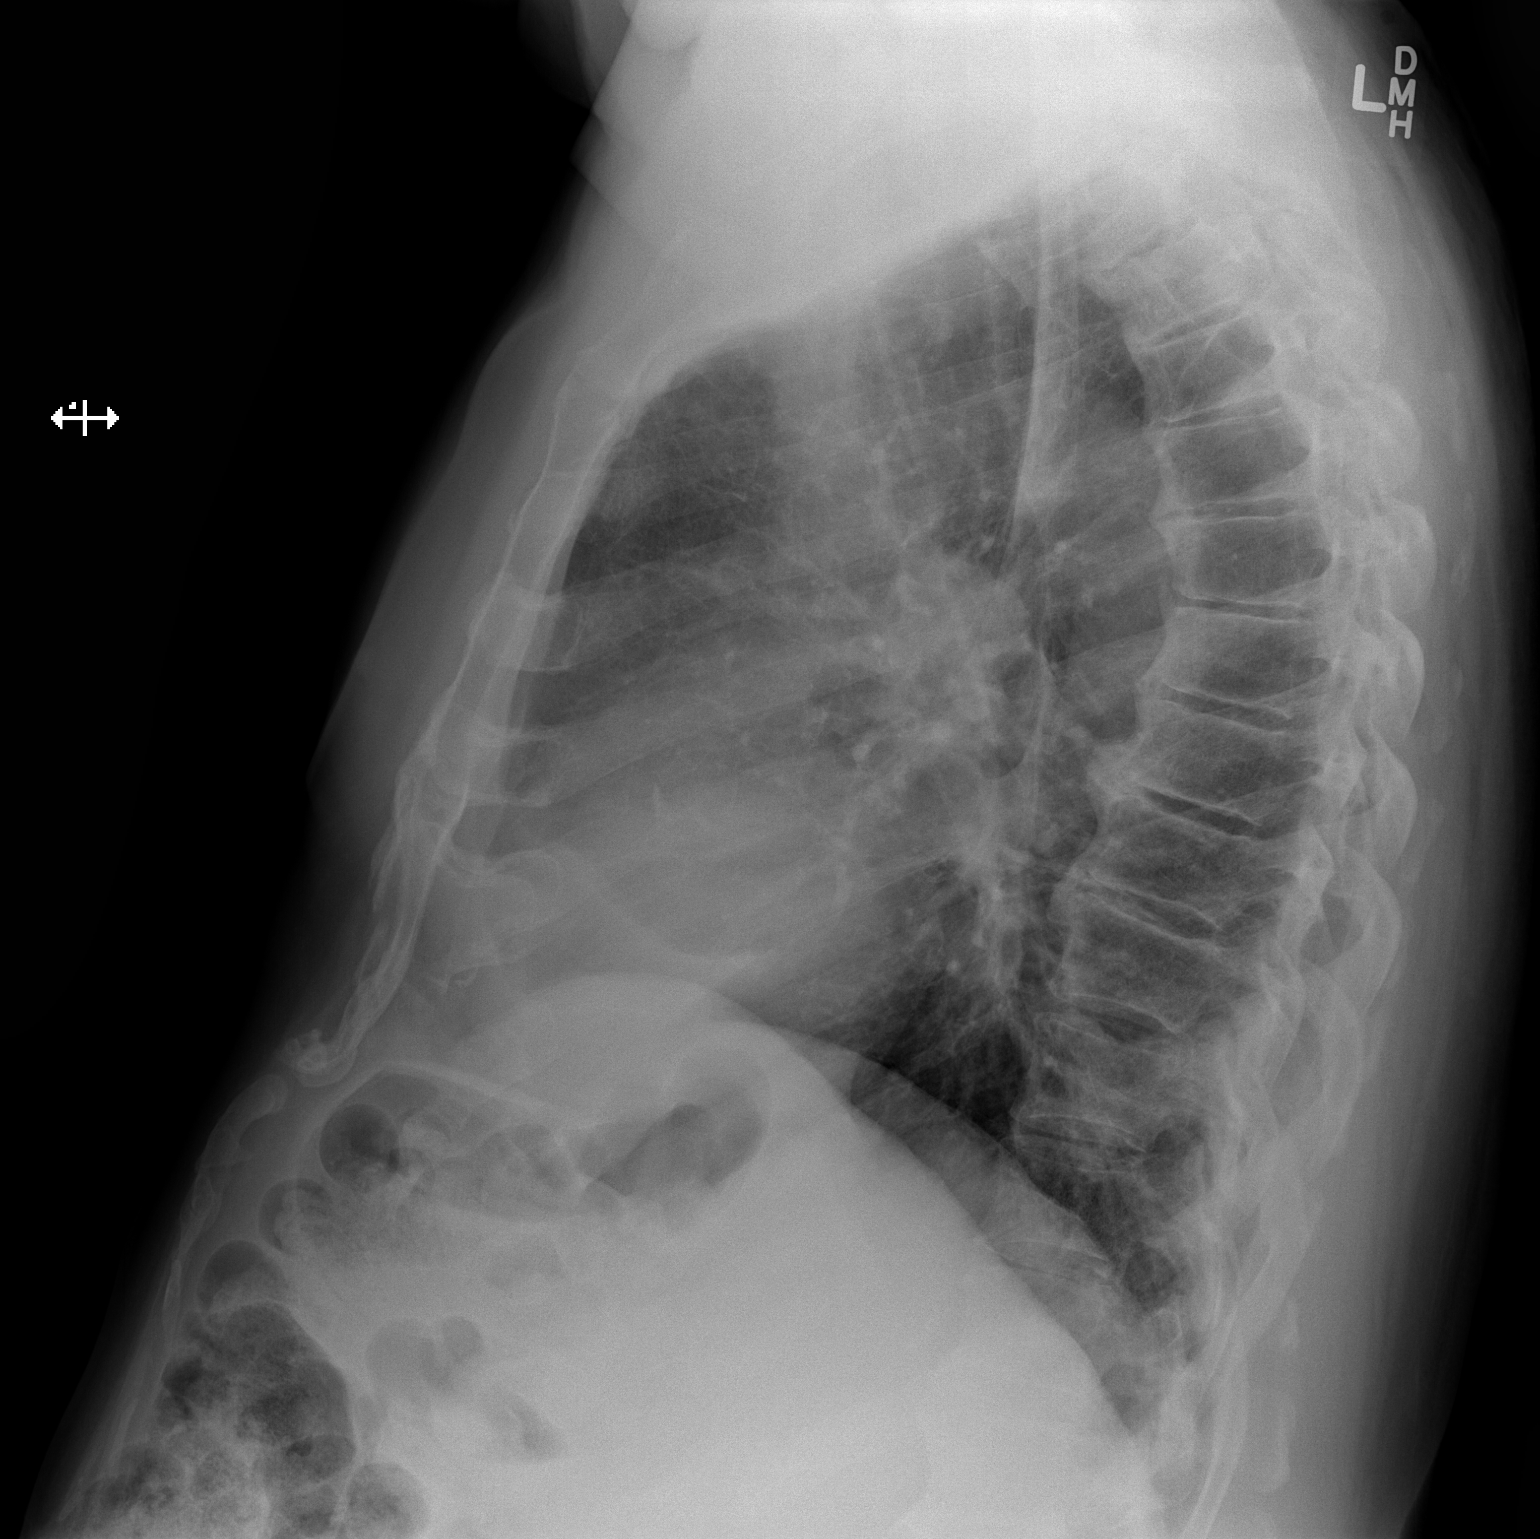

[2 of 2 positions shown; findings below may reference images not displayed]

FINDINGS: The lungs are adequately inflated. There are stable bullous lesions
peripherally in the right upper lobe. Linear density at the left
lung base is compatible with atelectasis. The heart and pulmonary
vascularity are normal. There is calcification in the wall of the
aortic arch. There is multilevel degenerative disc disease of the
thoracic spine.
IMPRESSION: Chronic emphysematous changes.  No acute pneumonia nor CHF.

Thoracic aortic atherosclerosis.

## 2019-11-04 DIAGNOSIS — I10 Essential (primary) hypertension: Secondary | ICD-10-CM | POA: Diagnosis not present

## 2019-11-04 DIAGNOSIS — E1151 Type 2 diabetes mellitus with diabetic peripheral angiopathy without gangrene: Secondary | ICD-10-CM | POA: Diagnosis not present

## 2020-02-03 DIAGNOSIS — E1151 Type 2 diabetes mellitus with diabetic peripheral angiopathy without gangrene: Secondary | ICD-10-CM | POA: Diagnosis not present

## 2020-02-03 DIAGNOSIS — Z125 Encounter for screening for malignant neoplasm of prostate: Secondary | ICD-10-CM | POA: Diagnosis not present

## 2020-02-03 DIAGNOSIS — E7849 Other hyperlipidemia: Secondary | ICD-10-CM | POA: Diagnosis not present

## 2020-02-05 DIAGNOSIS — J309 Allergic rhinitis, unspecified: Secondary | ICD-10-CM | POA: Diagnosis not present

## 2020-02-05 DIAGNOSIS — E1151 Type 2 diabetes mellitus with diabetic peripheral angiopathy without gangrene: Secondary | ICD-10-CM | POA: Diagnosis not present

## 2020-02-05 DIAGNOSIS — D126 Benign neoplasm of colon, unspecified: Secondary | ICD-10-CM | POA: Diagnosis not present

## 2020-02-05 DIAGNOSIS — Z1331 Encounter for screening for depression: Secondary | ICD-10-CM | POA: Diagnosis not present

## 2020-02-05 DIAGNOSIS — Z0000 Encounter for general adult medical examination without abnormal findings: Secondary | ICD-10-CM | POA: Diagnosis not present

## 2020-02-05 DIAGNOSIS — Z1212 Encounter for screening for malignant neoplasm of rectum: Secondary | ICD-10-CM | POA: Diagnosis not present

## 2020-02-05 DIAGNOSIS — R82998 Other abnormal findings in urine: Secondary | ICD-10-CM | POA: Diagnosis not present

## 2020-02-05 DIAGNOSIS — E785 Hyperlipidemia, unspecified: Secondary | ICD-10-CM | POA: Diagnosis not present

## 2020-02-05 DIAGNOSIS — I10 Essential (primary) hypertension: Secondary | ICD-10-CM | POA: Diagnosis not present

## 2020-02-05 DIAGNOSIS — J449 Chronic obstructive pulmonary disease, unspecified: Secondary | ICD-10-CM | POA: Diagnosis not present

## 2020-02-05 DIAGNOSIS — M179 Osteoarthritis of knee, unspecified: Secondary | ICD-10-CM | POA: Diagnosis not present

## 2020-02-05 DIAGNOSIS — Z8679 Personal history of other diseases of the circulatory system: Secondary | ICD-10-CM | POA: Diagnosis not present

## 2020-02-10 DIAGNOSIS — H43811 Vitreous degeneration, right eye: Secondary | ICD-10-CM | POA: Diagnosis not present

## 2020-02-10 DIAGNOSIS — H04123 Dry eye syndrome of bilateral lacrimal glands: Secondary | ICD-10-CM | POA: Diagnosis not present

## 2020-02-10 DIAGNOSIS — Z961 Presence of intraocular lens: Secondary | ICD-10-CM | POA: Diagnosis not present

## 2020-02-10 DIAGNOSIS — H40013 Open angle with borderline findings, low risk, bilateral: Secondary | ICD-10-CM | POA: Diagnosis not present

## 2020-02-10 DIAGNOSIS — H2512 Age-related nuclear cataract, left eye: Secondary | ICD-10-CM | POA: Diagnosis not present

## 2020-04-15 DIAGNOSIS — Z23 Encounter for immunization: Secondary | ICD-10-CM | POA: Diagnosis not present

## 2020-05-06 DIAGNOSIS — Z23 Encounter for immunization: Secondary | ICD-10-CM | POA: Diagnosis not present

## 2020-05-11 DIAGNOSIS — L509 Urticaria, unspecified: Secondary | ICD-10-CM | POA: Diagnosis not present

## 2020-05-11 DIAGNOSIS — E1151 Type 2 diabetes mellitus with diabetic peripheral angiopathy without gangrene: Secondary | ICD-10-CM | POA: Diagnosis not present

## 2020-05-11 DIAGNOSIS — L568 Other specified acute skin changes due to ultraviolet radiation: Secondary | ICD-10-CM | POA: Diagnosis not present

## 2020-05-17 DIAGNOSIS — J449 Chronic obstructive pulmonary disease, unspecified: Secondary | ICD-10-CM | POA: Diagnosis not present

## 2020-05-17 DIAGNOSIS — I10 Essential (primary) hypertension: Secondary | ICD-10-CM | POA: Diagnosis not present

## 2020-05-17 DIAGNOSIS — E1151 Type 2 diabetes mellitus with diabetic peripheral angiopathy without gangrene: Secondary | ICD-10-CM | POA: Diagnosis not present

## 2020-08-06 DIAGNOSIS — M25561 Pain in right knee: Secondary | ICD-10-CM | POA: Diagnosis not present

## 2020-08-06 DIAGNOSIS — M1711 Unilateral primary osteoarthritis, right knee: Secondary | ICD-10-CM | POA: Diagnosis not present

## 2020-08-06 DIAGNOSIS — M25531 Pain in right wrist: Secondary | ICD-10-CM | POA: Diagnosis not present

## 2020-08-06 DIAGNOSIS — S52601A Unspecified fracture of lower end of right ulna, initial encounter for closed fracture: Secondary | ICD-10-CM | POA: Diagnosis not present

## 2020-08-20 DIAGNOSIS — S52601A Unspecified fracture of lower end of right ulna, initial encounter for closed fracture: Secondary | ICD-10-CM | POA: Diagnosis not present

## 2020-08-24 DIAGNOSIS — R42 Dizziness and giddiness: Secondary | ICD-10-CM | POA: Diagnosis not present

## 2020-08-24 DIAGNOSIS — I10 Essential (primary) hypertension: Secondary | ICD-10-CM | POA: Diagnosis not present

## 2020-09-14 DIAGNOSIS — E1151 Type 2 diabetes mellitus with diabetic peripheral angiopathy without gangrene: Secondary | ICD-10-CM | POA: Diagnosis not present

## 2020-09-14 DIAGNOSIS — I10 Essential (primary) hypertension: Secondary | ICD-10-CM | POA: Diagnosis not present

## 2020-09-15 DIAGNOSIS — S52601A Unspecified fracture of lower end of right ulna, initial encounter for closed fracture: Secondary | ICD-10-CM | POA: Diagnosis not present

## 2020-11-16 DIAGNOSIS — Z23 Encounter for immunization: Secondary | ICD-10-CM | POA: Diagnosis not present

## 2021-02-14 DIAGNOSIS — Z961 Presence of intraocular lens: Secondary | ICD-10-CM | POA: Diagnosis not present

## 2021-02-14 DIAGNOSIS — H43811 Vitreous degeneration, right eye: Secondary | ICD-10-CM | POA: Diagnosis not present

## 2021-02-14 DIAGNOSIS — H04123 Dry eye syndrome of bilateral lacrimal glands: Secondary | ICD-10-CM | POA: Diagnosis not present

## 2021-02-14 DIAGNOSIS — H40013 Open angle with borderline findings, low risk, bilateral: Secondary | ICD-10-CM | POA: Diagnosis not present

## 2021-02-14 DIAGNOSIS — E119 Type 2 diabetes mellitus without complications: Secondary | ICD-10-CM | POA: Diagnosis not present

## 2021-02-14 DIAGNOSIS — H2512 Age-related nuclear cataract, left eye: Secondary | ICD-10-CM | POA: Diagnosis not present

## 2021-02-21 DIAGNOSIS — E785 Hyperlipidemia, unspecified: Secondary | ICD-10-CM | POA: Diagnosis not present

## 2021-02-21 DIAGNOSIS — Z125 Encounter for screening for malignant neoplasm of prostate: Secondary | ICD-10-CM | POA: Diagnosis not present

## 2021-02-21 DIAGNOSIS — E1151 Type 2 diabetes mellitus with diabetic peripheral angiopathy without gangrene: Secondary | ICD-10-CM | POA: Diagnosis not present

## 2021-02-28 DIAGNOSIS — Z8679 Personal history of other diseases of the circulatory system: Secondary | ICD-10-CM | POA: Diagnosis not present

## 2021-02-28 DIAGNOSIS — D126 Benign neoplasm of colon, unspecified: Secondary | ICD-10-CM | POA: Diagnosis not present

## 2021-02-28 DIAGNOSIS — M179 Osteoarthritis of knee, unspecified: Secondary | ICD-10-CM | POA: Diagnosis not present

## 2021-02-28 DIAGNOSIS — E785 Hyperlipidemia, unspecified: Secondary | ICD-10-CM | POA: Diagnosis not present

## 2021-02-28 DIAGNOSIS — J449 Chronic obstructive pulmonary disease, unspecified: Secondary | ICD-10-CM | POA: Diagnosis not present

## 2021-02-28 DIAGNOSIS — I10 Essential (primary) hypertension: Secondary | ICD-10-CM | POA: Diagnosis not present

## 2021-02-28 DIAGNOSIS — R82998 Other abnormal findings in urine: Secondary | ICD-10-CM | POA: Diagnosis not present

## 2021-02-28 DIAGNOSIS — E1151 Type 2 diabetes mellitus with diabetic peripheral angiopathy without gangrene: Secondary | ICD-10-CM | POA: Diagnosis not present

## 2021-02-28 DIAGNOSIS — K279 Peptic ulcer, site unspecified, unspecified as acute or chronic, without hemorrhage or perforation: Secondary | ICD-10-CM | POA: Diagnosis not present

## 2021-02-28 DIAGNOSIS — Z0000 Encounter for general adult medical examination without abnormal findings: Secondary | ICD-10-CM | POA: Diagnosis not present

## 2021-02-28 DIAGNOSIS — Z1212 Encounter for screening for malignant neoplasm of rectum: Secondary | ICD-10-CM | POA: Diagnosis not present

## 2021-03-16 DIAGNOSIS — R82998 Other abnormal findings in urine: Secondary | ICD-10-CM | POA: Diagnosis not present

## 2021-03-30 DIAGNOSIS — E785 Hyperlipidemia, unspecified: Secondary | ICD-10-CM | POA: Diagnosis not present

## 2021-03-30 DIAGNOSIS — I10 Essential (primary) hypertension: Secondary | ICD-10-CM | POA: Diagnosis not present

## 2021-03-30 DIAGNOSIS — E1151 Type 2 diabetes mellitus with diabetic peripheral angiopathy without gangrene: Secondary | ICD-10-CM | POA: Diagnosis not present

## 2021-04-05 DIAGNOSIS — Z23 Encounter for immunization: Secondary | ICD-10-CM | POA: Diagnosis not present

## 2021-06-10 DIAGNOSIS — L570 Actinic keratosis: Secondary | ICD-10-CM | POA: Diagnosis not present

## 2021-06-10 DIAGNOSIS — D485 Neoplasm of uncertain behavior of skin: Secondary | ICD-10-CM | POA: Diagnosis not present

## 2021-06-10 DIAGNOSIS — L814 Other melanin hyperpigmentation: Secondary | ICD-10-CM | POA: Diagnosis not present

## 2021-06-10 DIAGNOSIS — D1801 Hemangioma of skin and subcutaneous tissue: Secondary | ICD-10-CM | POA: Diagnosis not present

## 2021-06-10 DIAGNOSIS — D225 Melanocytic nevi of trunk: Secondary | ICD-10-CM | POA: Diagnosis not present

## 2021-06-10 DIAGNOSIS — L989 Disorder of the skin and subcutaneous tissue, unspecified: Secondary | ICD-10-CM | POA: Diagnosis not present

## 2021-06-10 DIAGNOSIS — L820 Inflamed seborrheic keratosis: Secondary | ICD-10-CM | POA: Diagnosis not present

## 2021-06-15 DIAGNOSIS — D225 Melanocytic nevi of trunk: Secondary | ICD-10-CM | POA: Diagnosis not present

## 2021-06-23 ENCOUNTER — Other Ambulatory Visit: Payer: Self-pay

## 2021-06-23 ENCOUNTER — Ambulatory Visit (INDEPENDENT_AMBULATORY_CARE_PROVIDER_SITE_OTHER): Payer: Medicare Other | Admitting: Podiatry

## 2021-06-23 ENCOUNTER — Encounter: Payer: Self-pay | Admitting: Podiatry

## 2021-06-23 DIAGNOSIS — L600 Ingrowing nail: Secondary | ICD-10-CM | POA: Diagnosis not present

## 2021-06-23 DIAGNOSIS — D169 Benign neoplasm of bone and articular cartilage, unspecified: Secondary | ICD-10-CM

## 2021-06-23 NOTE — Patient Instructions (Signed)

## 2021-06-26 NOTE — Progress Notes (Signed)
Subjective:   Patient ID: Trevor Bird, male   DOB: 75 y.o.   MRN: [id number]   HPI Patient presents concerned about abnormal nail of the right fifth with structural deformity and does not know whether it may be related to the bony position of the digit.  Patient states this is been going on for a long time and patient does not smoke currently likes to be active   Review of Systems  All other systems reviewed and are negative.      Objective:  Physical Exam Vitals and nursing note reviewed.  Constitutional:      Appearance: He is well-developed.  Pulmonary:     Effort: Pulmonary effort is normal.  Musculoskeletal:        General: Normal range of motion.  Skin:    General: Skin is warm.  Neurological:     Mental Status: He is alert.    Neurovascular status intact muscle strength adequate range of motion was adequate with patient found to have good digital perfusion well oriented.  There is moderate rotation of the fifth digit right there is nail disease with enlargement of the nailbed and lateral irritation with also the possibility for keratotic type lesion formation     Assessment:  Structural damage to the nailbed fifth right with also the possibility of bone spur formation which may be contributory secondary to foot structure     Plan:  NP educated him on condition and I recommended conservative treatment.  We will get a try to remove the nail see if this solves this problem with the possibility for bone spur resection or digital derotation.  Today I went ahead and I allowed him to read and signed consent form and I infiltrated the right fifth digit 60 mg like Marcaine mixture sterile prep done and using sterile instrumentation I remove the nail exposed matrix and applied phenol for applications 30 seconds followed by alcohol lavage and sterile dressing.  Gave instructions on soaks and to leave dressing on 24 hours but take it off earlier if any throbbing were to occur and  encouraged him to call questions concerns

## 2021-07-06 DIAGNOSIS — M179 Osteoarthritis of knee, unspecified: Secondary | ICD-10-CM | POA: Diagnosis not present

## 2021-07-06 DIAGNOSIS — E785 Hyperlipidemia, unspecified: Secondary | ICD-10-CM | POA: Diagnosis not present

## 2021-07-06 DIAGNOSIS — K279 Peptic ulcer, site unspecified, unspecified as acute or chronic, without hemorrhage or perforation: Secondary | ICD-10-CM | POA: Diagnosis not present

## 2021-07-06 DIAGNOSIS — I10 Essential (primary) hypertension: Secondary | ICD-10-CM | POA: Diagnosis not present

## 2021-07-06 DIAGNOSIS — Z8679 Personal history of other diseases of the circulatory system: Secondary | ICD-10-CM | POA: Diagnosis not present

## 2021-07-06 DIAGNOSIS — E1151 Type 2 diabetes mellitus with diabetic peripheral angiopathy without gangrene: Secondary | ICD-10-CM | POA: Diagnosis not present

## 2021-07-06 DIAGNOSIS — J449 Chronic obstructive pulmonary disease, unspecified: Secondary | ICD-10-CM | POA: Diagnosis not present

## 2021-10-10 ENCOUNTER — Ambulatory Visit (INDEPENDENT_AMBULATORY_CARE_PROVIDER_SITE_OTHER): Payer: Medicare Other

## 2021-10-10 ENCOUNTER — Encounter: Payer: Self-pay | Admitting: Podiatry

## 2021-10-10 ENCOUNTER — Other Ambulatory Visit: Payer: Self-pay

## 2021-10-10 ENCOUNTER — Ambulatory Visit (INDEPENDENT_AMBULATORY_CARE_PROVIDER_SITE_OTHER): Payer: Medicare Other | Admitting: Podiatry

## 2021-10-10 DIAGNOSIS — D169 Benign neoplasm of bone and articular cartilage, unspecified: Secondary | ICD-10-CM | POA: Diagnosis not present

## 2021-10-10 DIAGNOSIS — M898X9 Other specified disorders of bone, unspecified site: Secondary | ICD-10-CM | POA: Diagnosis not present

## 2021-10-10 NOTE — Progress Notes (Signed)
Subjective:   Patient ID: Trevor Bird, male   DOB: 75 y.o.   MRN: [id number]   HPI Patient states the spot is really still hurting me a lot and I think I need to have it fixed   ROS      Objective:  Physical Exam  Neuro vascular status intact with patient found to have keratotic exostotic lesion digit 5 left but did not respond to trying to remove toenail and is very tender     Assessment:  Exostosis of the fifth digit left has not responded to other treatments with good digital perfusion     Plan:  H&P reviewed condition discussed exostectomy patient wants surgery and I allowed him to read consent form for correction explained procedure risk and he wants to have this done.  Patient signed consent form scheduled for outpatient surgery encouraged to call with questions prior to procedure and this will be done in the office  X-rays indicate that there is a abutment of the fourth and fifth digit left with bone spur formation fifth toe

## 2021-10-12 DIAGNOSIS — M25512 Pain in left shoulder: Secondary | ICD-10-CM | POA: Diagnosis not present

## 2021-10-17 ENCOUNTER — Ambulatory Visit (INDEPENDENT_AMBULATORY_CARE_PROVIDER_SITE_OTHER): Payer: Medicare Other | Admitting: Podiatry

## 2021-10-17 ENCOUNTER — Other Ambulatory Visit: Payer: Self-pay

## 2021-10-17 ENCOUNTER — Encounter: Payer: Self-pay | Admitting: Podiatry

## 2021-10-17 VITALS — BP 138/69 | HR 59 | Temp 97.1°F | Resp 14

## 2021-10-17 DIAGNOSIS — D169 Benign neoplasm of bone and articular cartilage, unspecified: Secondary | ICD-10-CM | POA: Diagnosis not present

## 2021-10-17 NOTE — Progress Notes (Signed)
Subjective:   Patient ID: Trevor Bird, male   DOB: 75 y.o.   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  Patient injected with 60 mg like Marcaine mixture sterile prep applied left foot left ankle tourniquet inflated to 200 mmHg following procedure was performed.  Tension was noted to the left fifth digit which was prepped and I then went ahead and made some elliptical incision over a painful keratotic lesion of the distal medial aspect of the toe.  I took it through deep tissue down to bone and remove the intervening skin wedge in toto.  I then went ahead and taking a sidecutting bur I burred the area down and remove bone paste flushed with copious collection solution found to be adequately resected and utilized a 5-0 nylon stitch to stitch the skin.  Applied sterile dressing tourniquet released capillary fill to be immediate to all digits on the left foot patient left the OR in satisfactory condition with all postoperative instructions

## 2021-10-31 ENCOUNTER — Encounter: Payer: Medicare Other | Admitting: Podiatry

## 2021-10-31 DIAGNOSIS — R8279 Other abnormal findings on microbiological examination of urine: Secondary | ICD-10-CM | POA: Diagnosis not present

## 2021-10-31 DIAGNOSIS — N492 Inflammatory disorders of scrotum: Secondary | ICD-10-CM | POA: Diagnosis not present

## 2021-11-02 ENCOUNTER — Ambulatory Visit (INDEPENDENT_AMBULATORY_CARE_PROVIDER_SITE_OTHER): Payer: Medicare Other

## 2021-11-02 ENCOUNTER — Other Ambulatory Visit: Payer: Self-pay

## 2021-11-02 ENCOUNTER — Encounter: Payer: Self-pay | Admitting: Podiatry

## 2021-11-02 ENCOUNTER — Ambulatory Visit (INDEPENDENT_AMBULATORY_CARE_PROVIDER_SITE_OTHER): Payer: Medicare Other | Admitting: Podiatry

## 2021-11-02 DIAGNOSIS — Z9889 Other specified postprocedural states: Secondary | ICD-10-CM

## 2021-11-02 NOTE — Progress Notes (Signed)
Subjective:   Patient ID: Trevor Bird, male   DOB: 75 y.o.   MRN: [id number]   HPI Patient presents stating doing fine stitches intact and walking well   ROS      Objective:  Physical Exam  Neurovascular status intact negative Bevelyn Buckles' sign noted incision site medial side fifth digit left healing well stitches intact     Assessment:  Doing well post exostectomy digit 5 left     Plan:  Stitches removed wound edges coapted well applied Band-Aid continue wider shoe gear reappoint as needed  X-rays indicate satisfactory resection of bone good alignment noted

## 2021-11-07 ENCOUNTER — Other Ambulatory Visit: Payer: Medicare Other

## 2021-11-11 DIAGNOSIS — M25512 Pain in left shoulder: Secondary | ICD-10-CM | POA: Diagnosis not present

## 2021-11-11 DIAGNOSIS — M179 Osteoarthritis of knee, unspecified: Secondary | ICD-10-CM | POA: Diagnosis not present

## 2021-11-11 DIAGNOSIS — E785 Hyperlipidemia, unspecified: Secondary | ICD-10-CM | POA: Diagnosis not present

## 2021-11-11 DIAGNOSIS — J449 Chronic obstructive pulmonary disease, unspecified: Secondary | ICD-10-CM | POA: Diagnosis not present

## 2021-11-11 DIAGNOSIS — D126 Benign neoplasm of colon, unspecified: Secondary | ICD-10-CM | POA: Diagnosis not present

## 2021-11-11 DIAGNOSIS — Z23 Encounter for immunization: Secondary | ICD-10-CM | POA: Diagnosis not present

## 2021-11-11 DIAGNOSIS — Z8679 Personal history of other diseases of the circulatory system: Secondary | ICD-10-CM | POA: Diagnosis not present

## 2021-11-11 DIAGNOSIS — E1151 Type 2 diabetes mellitus with diabetic peripheral angiopathy without gangrene: Secondary | ICD-10-CM | POA: Diagnosis not present

## 2021-11-11 DIAGNOSIS — I10 Essential (primary) hypertension: Secondary | ICD-10-CM | POA: Diagnosis not present

## 2021-11-11 DIAGNOSIS — K279 Peptic ulcer, site unspecified, unspecified as acute or chronic, without hemorrhage or perforation: Secondary | ICD-10-CM | POA: Diagnosis not present

## 2021-12-18 DIAGNOSIS — Z20822 Contact with and (suspected) exposure to covid-19: Secondary | ICD-10-CM | POA: Diagnosis not present

## 2022-02-15 DIAGNOSIS — Z961 Presence of intraocular lens: Secondary | ICD-10-CM | POA: Diagnosis not present

## 2022-02-15 DIAGNOSIS — E119 Type 2 diabetes mellitus without complications: Secondary | ICD-10-CM | POA: Diagnosis not present

## 2022-02-15 DIAGNOSIS — H2512 Age-related nuclear cataract, left eye: Secondary | ICD-10-CM | POA: Diagnosis not present

## 2022-02-15 DIAGNOSIS — H04123 Dry eye syndrome of bilateral lacrimal glands: Secondary | ICD-10-CM | POA: Diagnosis not present

## 2022-02-15 DIAGNOSIS — H43811 Vitreous degeneration, right eye: Secondary | ICD-10-CM | POA: Diagnosis not present

## 2022-02-15 DIAGNOSIS — H40013 Open angle with borderline findings, low risk, bilateral: Secondary | ICD-10-CM | POA: Diagnosis not present

## 2022-02-20 ENCOUNTER — Encounter: Payer: Self-pay | Admitting: Gastroenterology

## 2022-02-28 DIAGNOSIS — Z125 Encounter for screening for malignant neoplasm of prostate: Secondary | ICD-10-CM | POA: Diagnosis not present

## 2022-02-28 DIAGNOSIS — E1151 Type 2 diabetes mellitus with diabetic peripheral angiopathy without gangrene: Secondary | ICD-10-CM | POA: Diagnosis not present

## 2022-02-28 DIAGNOSIS — E785 Hyperlipidemia, unspecified: Secondary | ICD-10-CM | POA: Diagnosis not present

## 2022-02-28 DIAGNOSIS — I10 Essential (primary) hypertension: Secondary | ICD-10-CM | POA: Diagnosis not present

## 2022-03-03 DIAGNOSIS — R82998 Other abnormal findings in urine: Secondary | ICD-10-CM | POA: Diagnosis not present

## 2022-03-03 DIAGNOSIS — Z1212 Encounter for screening for malignant neoplasm of rectum: Secondary | ICD-10-CM | POA: Diagnosis not present

## 2022-03-07 DIAGNOSIS — D126 Benign neoplasm of colon, unspecified: Secondary | ICD-10-CM | POA: Diagnosis not present

## 2022-03-07 DIAGNOSIS — Z0000 Encounter for general adult medical examination without abnormal findings: Secondary | ICD-10-CM | POA: Diagnosis not present

## 2022-03-07 DIAGNOSIS — E785 Hyperlipidemia, unspecified: Secondary | ICD-10-CM | POA: Diagnosis not present

## 2022-03-07 DIAGNOSIS — I10 Essential (primary) hypertension: Secondary | ICD-10-CM | POA: Diagnosis not present

## 2022-03-07 DIAGNOSIS — J449 Chronic obstructive pulmonary disease, unspecified: Secondary | ICD-10-CM | POA: Diagnosis not present

## 2022-03-07 DIAGNOSIS — Z8679 Personal history of other diseases of the circulatory system: Secondary | ICD-10-CM | POA: Diagnosis not present

## 2022-03-07 DIAGNOSIS — E1151 Type 2 diabetes mellitus with diabetic peripheral angiopathy without gangrene: Secondary | ICD-10-CM | POA: Diagnosis not present

## 2022-03-07 DIAGNOSIS — M179 Osteoarthritis of knee, unspecified: Secondary | ICD-10-CM | POA: Diagnosis not present

## 2022-03-07 DIAGNOSIS — K279 Peptic ulcer, site unspecified, unspecified as acute or chronic, without hemorrhage or perforation: Secondary | ICD-10-CM | POA: Diagnosis not present

## 2022-03-31 DIAGNOSIS — Z20822 Contact with and (suspected) exposure to covid-19: Secondary | ICD-10-CM | POA: Diagnosis not present

## 2022-04-07 DIAGNOSIS — Z20822 Contact with and (suspected) exposure to covid-19: Secondary | ICD-10-CM | POA: Diagnosis not present

## 2022-04-12 DIAGNOSIS — Z20822 Contact with and (suspected) exposure to covid-19: Secondary | ICD-10-CM | POA: Diagnosis not present

## 2022-04-13 DIAGNOSIS — Z20822 Contact with and (suspected) exposure to covid-19: Secondary | ICD-10-CM | POA: Diagnosis not present

## 2022-04-20 NOTE — Patient Outreach (Signed)
Received a Health Coach referral notification for Trevor Bird. ?I have assigned Trevor Loron, RN to call for follow up and determine if there are any Case Management needs.  ?  ?Arville Care, CBCS, CMAA ?[place] Management Assistant ?[facility] ?[phone]    ?

## 2022-04-26 ENCOUNTER — Other Ambulatory Visit: Payer: Self-pay | Admitting: *Deleted

## 2022-04-26 NOTE — Patient Outreach (Signed)
[facility]) Care Management ? ?04/26/2022 ? ?Kevin Fenton ?1946/05/23 ?[id number] ? ?Unsuccessful outreach attempt made to patient to discuss [facility] services. Patient answered the phone and stated that he would not be able to speak today. Patient did request that this nurse call back at a later date.  ? ?Plan: ?RN Health Coach will call patient within the next several business days and will send an unsuccessful letter. ? ?Emelia Loron RN, BSN ?[facility] Care Management  ?RN Health Coach ?[phone] ?[email or web address] ? ? ?

## 2022-05-05 ENCOUNTER — Other Ambulatory Visit: Payer: Self-pay | Admitting: *Deleted

## 2022-05-05 NOTE — Patient Outreach (Signed)
[facility]) Care Management  05/05/2022  Trevor Bird [birth year] [id number] Outreach attempt to patient. No answer and unable to leave voicemail message due to phone rang until a VM stated: your call cannot be completed at this time. Please hang up and try again later.   Plan: RN Health Coach will call patient within the Next several business days.  Emelia Loron RN, BSN [place] [phone] [name]

## 2022-05-10 ENCOUNTER — Other Ambulatory Visit: Payer: Self-pay | Admitting: *Deleted

## 2022-05-10 NOTE — Patient Outreach (Signed)
[facility]) Care Management  05/10/2022  Trevor Bird Apr 07, 1946 [id number]  Unsuccessful outreach attempt made to patient. Patient answered the phone and stated that he would not be able to speak today. Patient did request that this nurse call back at a later date.   Plan: RN Health Coach will call patient within the month of June.  Emelia Loron RN, BSN [place] [phone] [name]

## 2022-05-25 ENCOUNTER — Ambulatory Visit: Payer: Self-pay | Admitting: *Deleted

## 2022-07-14 DIAGNOSIS — E785 Hyperlipidemia, unspecified: Secondary | ICD-10-CM | POA: Diagnosis not present

## 2022-07-14 DIAGNOSIS — E1151 Type 2 diabetes mellitus with diabetic peripheral angiopathy without gangrene: Secondary | ICD-10-CM | POA: Diagnosis not present

## 2022-07-14 DIAGNOSIS — I10 Essential (primary) hypertension: Secondary | ICD-10-CM | POA: Diagnosis not present

## 2022-07-14 DIAGNOSIS — M179 Osteoarthritis of knee, unspecified: Secondary | ICD-10-CM | POA: Diagnosis not present

## 2022-07-14 DIAGNOSIS — J449 Chronic obstructive pulmonary disease, unspecified: Secondary | ICD-10-CM | POA: Diagnosis not present

## 2022-07-14 DIAGNOSIS — D126 Benign neoplasm of colon, unspecified: Secondary | ICD-10-CM | POA: Diagnosis not present

## 2022-07-14 DIAGNOSIS — K279 Peptic ulcer, site unspecified, unspecified as acute or chronic, without hemorrhage or perforation: Secondary | ICD-10-CM | POA: Diagnosis not present

## 2022-07-14 DIAGNOSIS — Z8679 Personal history of other diseases of the circulatory system: Secondary | ICD-10-CM | POA: Diagnosis not present

## 2022-09-25 DIAGNOSIS — M25561 Pain in right knee: Secondary | ICD-10-CM | POA: Diagnosis not present

## 2022-09-25 DIAGNOSIS — M1711 Unilateral primary osteoarthritis, right knee: Secondary | ICD-10-CM | POA: Diagnosis not present

## 2022-11-23 DIAGNOSIS — Z8679 Personal history of other diseases of the circulatory system: Secondary | ICD-10-CM | POA: Diagnosis not present

## 2022-11-23 DIAGNOSIS — K279 Peptic ulcer, site unspecified, unspecified as acute or chronic, without hemorrhage or perforation: Secondary | ICD-10-CM | POA: Diagnosis not present

## 2022-11-23 DIAGNOSIS — D126 Benign neoplasm of colon, unspecified: Secondary | ICD-10-CM | POA: Diagnosis not present

## 2022-11-23 DIAGNOSIS — E1151 Type 2 diabetes mellitus with diabetic peripheral angiopathy without gangrene: Secondary | ICD-10-CM | POA: Diagnosis not present

## 2022-11-23 DIAGNOSIS — E785 Hyperlipidemia, unspecified: Secondary | ICD-10-CM | POA: Diagnosis not present

## 2022-11-23 DIAGNOSIS — Z23 Encounter for immunization: Secondary | ICD-10-CM | POA: Diagnosis not present

## 2022-11-23 DIAGNOSIS — I10 Essential (primary) hypertension: Secondary | ICD-10-CM | POA: Diagnosis not present

## 2022-11-23 DIAGNOSIS — J449 Chronic obstructive pulmonary disease, unspecified: Secondary | ICD-10-CM | POA: Diagnosis not present

## 2022-11-23 DIAGNOSIS — M179 Osteoarthritis of knee, unspecified: Secondary | ICD-10-CM | POA: Diagnosis not present

## 2023-02-21 DIAGNOSIS — Z961 Presence of intraocular lens: Secondary | ICD-10-CM | POA: Diagnosis not present

## 2023-02-21 DIAGNOSIS — H40013 Open angle with borderline findings, low risk, bilateral: Secondary | ICD-10-CM | POA: Diagnosis not present

## 2023-02-21 DIAGNOSIS — H04123 Dry eye syndrome of bilateral lacrimal glands: Secondary | ICD-10-CM | POA: Diagnosis not present

## 2023-02-21 DIAGNOSIS — H43811 Vitreous degeneration, right eye: Secondary | ICD-10-CM | POA: Diagnosis not present

## 2023-02-21 DIAGNOSIS — E119 Type 2 diabetes mellitus without complications: Secondary | ICD-10-CM | POA: Diagnosis not present

## 2023-02-21 DIAGNOSIS — H2512 Age-related nuclear cataract, left eye: Secondary | ICD-10-CM | POA: Diagnosis not present

## 2023-03-02 DIAGNOSIS — H25812 Combined forms of age-related cataract, left eye: Secondary | ICD-10-CM | POA: Diagnosis not present

## 2023-03-23 DIAGNOSIS — E1151 Type 2 diabetes mellitus with diabetic peripheral angiopathy without gangrene: Secondary | ICD-10-CM | POA: Diagnosis not present

## 2023-03-23 DIAGNOSIS — R7989 Other specified abnormal findings of blood chemistry: Secondary | ICD-10-CM | POA: Diagnosis not present

## 2023-03-23 DIAGNOSIS — E785 Hyperlipidemia, unspecified: Secondary | ICD-10-CM | POA: Diagnosis not present

## 2023-03-23 DIAGNOSIS — Z125 Encounter for screening for malignant neoplasm of prostate: Secondary | ICD-10-CM | POA: Diagnosis not present

## 2023-03-23 DIAGNOSIS — I10 Essential (primary) hypertension: Secondary | ICD-10-CM | POA: Diagnosis not present

## 2023-03-30 DIAGNOSIS — Z23 Encounter for immunization: Secondary | ICD-10-CM | POA: Diagnosis not present

## 2023-03-30 DIAGNOSIS — Z1331 Encounter for screening for depression: Secondary | ICD-10-CM | POA: Diagnosis not present

## 2023-03-30 DIAGNOSIS — M179 Osteoarthritis of knee, unspecified: Secondary | ICD-10-CM | POA: Diagnosis not present

## 2023-03-30 DIAGNOSIS — D126 Benign neoplasm of colon, unspecified: Secondary | ICD-10-CM | POA: Diagnosis not present

## 2023-03-30 DIAGNOSIS — Z8679 Personal history of other diseases of the circulatory system: Secondary | ICD-10-CM | POA: Diagnosis not present

## 2023-03-30 DIAGNOSIS — E1151 Type 2 diabetes mellitus with diabetic peripheral angiopathy without gangrene: Secondary | ICD-10-CM | POA: Diagnosis not present

## 2023-03-30 DIAGNOSIS — Z1339 Encounter for screening examination for other mental health and behavioral disorders: Secondary | ICD-10-CM | POA: Diagnosis not present

## 2023-03-30 DIAGNOSIS — E785 Hyperlipidemia, unspecified: Secondary | ICD-10-CM | POA: Diagnosis not present

## 2023-03-30 DIAGNOSIS — Z0000 Encounter for general adult medical examination without abnormal findings: Secondary | ICD-10-CM | POA: Diagnosis not present

## 2023-03-30 DIAGNOSIS — J449 Chronic obstructive pulmonary disease, unspecified: Secondary | ICD-10-CM | POA: Diagnosis not present

## 2023-03-30 DIAGNOSIS — I10 Essential (primary) hypertension: Secondary | ICD-10-CM | POA: Diagnosis not present

## 2023-03-30 DIAGNOSIS — K279 Peptic ulcer, site unspecified, unspecified as acute or chronic, without hemorrhage or perforation: Secondary | ICD-10-CM | POA: Diagnosis not present

## 2023-05-02 DIAGNOSIS — L814 Other melanin hyperpigmentation: Secondary | ICD-10-CM | POA: Diagnosis not present

## 2023-05-02 DIAGNOSIS — I788 Other diseases of capillaries: Secondary | ICD-10-CM | POA: Diagnosis not present

## 2023-05-02 DIAGNOSIS — L728 Other follicular cysts of the skin and subcutaneous tissue: Secondary | ICD-10-CM | POA: Diagnosis not present

## 2023-05-02 DIAGNOSIS — L570 Actinic keratosis: Secondary | ICD-10-CM | POA: Diagnosis not present

## 2023-05-30 DIAGNOSIS — M1711 Unilateral primary osteoarthritis, right knee: Secondary | ICD-10-CM | POA: Diagnosis not present

## 2023-06-27 DIAGNOSIS — M1711 Unilateral primary osteoarthritis, right knee: Secondary | ICD-10-CM | POA: Diagnosis not present

## 2023-07-23 ENCOUNTER — Ambulatory Visit: Payer: Medicare Other | Admitting: Gastroenterology

## 2023-08-15 DIAGNOSIS — J449 Chronic obstructive pulmonary disease, unspecified: Secondary | ICD-10-CM | POA: Diagnosis not present

## 2023-08-15 DIAGNOSIS — I10 Essential (primary) hypertension: Secondary | ICD-10-CM | POA: Diagnosis not present

## 2023-08-15 DIAGNOSIS — E1151 Type 2 diabetes mellitus with diabetic peripheral angiopathy without gangrene: Secondary | ICD-10-CM | POA: Diagnosis not present

## 2023-08-15 DIAGNOSIS — R2681 Unsteadiness on feet: Secondary | ICD-10-CM | POA: Diagnosis not present

## 2023-08-15 DIAGNOSIS — D126 Benign neoplasm of colon, unspecified: Secondary | ICD-10-CM | POA: Diagnosis not present

## 2023-08-15 DIAGNOSIS — E785 Hyperlipidemia, unspecified: Secondary | ICD-10-CM | POA: Diagnosis not present

## 2023-08-15 DIAGNOSIS — M179 Osteoarthritis of knee, unspecified: Secondary | ICD-10-CM | POA: Diagnosis not present

## 2023-08-15 DIAGNOSIS — Z8679 Personal history of other diseases of the circulatory system: Secondary | ICD-10-CM | POA: Diagnosis not present

## 2023-08-15 DIAGNOSIS — K279 Peptic ulcer, site unspecified, unspecified as acute or chronic, without hemorrhage or perforation: Secondary | ICD-10-CM | POA: Diagnosis not present

## 2023-09-28 DIAGNOSIS — L821 Other seborrheic keratosis: Secondary | ICD-10-CM | POA: Diagnosis not present

## 2023-09-28 DIAGNOSIS — L720 Epidermal cyst: Secondary | ICD-10-CM | POA: Diagnosis not present

## 2023-09-28 DIAGNOSIS — L218 Other seborrheic dermatitis: Secondary | ICD-10-CM | POA: Diagnosis not present

## 2023-09-28 DIAGNOSIS — L820 Inflamed seborrheic keratosis: Secondary | ICD-10-CM | POA: Diagnosis not present

## 2023-09-28 DIAGNOSIS — L538 Other specified erythematous conditions: Secondary | ICD-10-CM | POA: Diagnosis not present

## 2023-10-01 DIAGNOSIS — M25512 Pain in left shoulder: Secondary | ICD-10-CM | POA: Diagnosis not present

## 2023-10-04 ENCOUNTER — Encounter: Payer: Self-pay | Admitting: Physician Assistant

## 2023-10-04 ENCOUNTER — Ambulatory Visit: Payer: Medicare Other | Admitting: Physician Assistant

## 2023-10-04 VITALS — BP 126/72 | HR 84 | Ht 69.5 in | Wt 217.0 lb

## 2023-10-04 DIAGNOSIS — Z860100 Personal history of colon polyps, unspecified: Secondary | ICD-10-CM

## 2023-10-04 NOTE — Progress Notes (Signed)
Agree with assessment and plan as outlined.  

## 2023-10-04 NOTE — Progress Notes (Signed)
Subjective:    Patient ID: Trevor Bird, male    DOB: [birth year], 77 y.o.   MRN: [id number]  HPI Trevor Bird is a pleasant 77 year old white male, established with Dr. Adela Lank, and last seen here in 2018 when he underwent colonoscopy. He is referred today by his PCP Dr. Vassie Loll for discussion regarding possible follow-up colonoscopy. Patient has history of ruptured abdominal aortic aneurysm, hypertension, osteoarthritis, hyperlipidemia, and adult onset diabetes mellitus.  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  One was a tubular adenoma and one was hyperplastic.  Indication was for consideration of colonoscopy at 7-year interval.  Also noted to have internal hemorrhoids.  Patient says he has no current GI complaints, he does not have any issues with bowel movements, no melena or hematochezia, no changes in bowel habits, no complaints of abdominal pain.  Family history negative for colon cancer.  Patient does not feel strongly that he wants to have a follow-up colonoscopy, but is willing if that is our recommendation.  Review of Systems Pertinent positive and negative review of systems were noted in the above HPI section.  All other review of systems was otherwise negative.   Outpatient Encounter Medications as of 10/04/2023  Medication Sig   amLODipine (NORVASC) 5 MG tablet Take 5 mg by mouth daily.   atorvastatin (LIPITOR) 40 MG tablet Take 40 mg by mouth daily.   Empagliflozin-metFORMIN HCl ER (SYNJARDY XR) 12.04-999 MG TB24 Take 1 tablet by mouth daily.   ibuprofen (ADVIL) 200 MG tablet Take 1,000 mg by mouth as needed.   metoprolol tartrate (LOPRESSOR) 50 MG tablet Take by mouth.   OneTouch Delica Lancets 30G MISC in the morning, at noon, and at bedtime.   ONETOUCH VERIO test strip 3 (three) times daily.   valsartan (DIOVAN) 320 MG tablet Take 160 mg by mouth daily.    [DISCONTINUED] amLODipine (NORVASC) 5 MG tablet Take by mouth.   [DISCONTINUED] atorvastatin (LIPITOR) 20 MG tablet Take by mouth.   [DISCONTINUED] bisacodyl (DULCOLAX) 5 MG EC tablet Take 2 tablets (10 mg total) daily as needed by mouth for moderate constipation.   [DISCONTINUED] docusate sodium (COLACE) 100 MG capsule Take 1 capsule (100 mg total) 2 (two) times daily by mouth.   [DISCONTINUED] losartan (COZAAR) 50 MG tablet Take 50 mg by mouth daily.   [DISCONTINUED] metFORMIN (GLUCOPHAGE) 1000 MG tablet Take 1,000 mg by mouth 2 (two) times daily.   [DISCONTINUED] metFORMIN (GLUCOPHAGE) 1000 MG tablet Take 1,000 mg by mouth as needed.   [DISCONTINUED] metoprolol succinate (TOPROL-XL) 50 MG 24 hr tablet Take 50 mg by mouth daily.   No facility-administered encounter medications on file as of 10/04/2023.   Allergies  Allergen Reactions   Sulfa Antibiotics Itching   Patient Active Problem List   Diagnosis Date Noted   Primary localized osteoarthritis of left knee 10/23/2017   Primary osteoarthritis of left knee 10/23/2017   AAA (abdominal aortic aneurysm, ruptured) (HCC) 07/01/2012   Benign essential hypertension 07/01/2012   Incisional hernia 07/01/2012   Pure hypercholesterolemia 07/01/2012   Social History   Socioeconomic History   Marital status: Unknown    Spouse name: Not on file   Number of children: 2   Years of education: Not on file   Highest education level: Not on file  Occupational History   Occupation: retired  Tobacco Use   Smoking status: Former  Current packs/day: 0.00    Types: Cigarettes    Quit date: 10/18/2012    Years since quitting: 10.9   Smokeless tobacco: Never  Vaping Use   Vaping status: Never Used  Substance and Sexual Activity   Alcohol use: Not Currently    Comment: I drink beer every couple of weeks "   Drug use: No   Sexual activity: Not on file  Other Topics Concern   Not on file  Social History Narrative   Not on file   Social  Determinants of Health   Financial Resource Strain: Not on file  Food Insecurity: Not on file  Transportation Needs: Not on file  Physical Activity: Not on file  Stress: Not on file  Social Connections: Not on file  Intimate Partner Violence: Not on file    Mr. Scholze family history includes Asthma in his son; Diabetes in his sister; Drug abuse in his brother; Emphysema in his father; Lung cancer in his father.      Objective:    Vitals:   10/04/23 0935  BP: 126/72  Pulse: 84    Physical Exam Well-developed well-nourished elderly white male in no acute distress.  Accompanied by his son Weight 217, BMI 31.5  HEENT; nontraumatic normocephalic, EOMI, PE R LA, sclera anicteric. Oropharynx; not done today Neck; supple, no JVD Cardiovascular; regular rate and rhythm with S1-S2, no murmur rub or gallop Pulmonary; Clear bilaterally Abdomen; soft, obese, nontender, nondistended, long midline incisional scar, with large incisional hernia above the umbilicus no palpable mass or hepatosplenomegaly, bowel sounds are active Rectal; not done today Skin; benign exam, no jaundice rash or appreciable lesions Extremities; no clubbing cyanosis or edema skin warm and dry Neuro/Psych; alert and oriented x4, grossly nonfocal mood and affect appropriate        Assessment & Plan:   #64 77 year old white male with history of adenomatous colon polyps, last colonoscopy February 2018 with one 5 mm tubular adenoma, and 1 hyperplastic polyp.  No current GI concerns or complaints  #2 history of ruptured abdominal aortic aneurysm status post repair #3 incisional hernia midline #4 hypertension #5.  Hyperlipidemia #6.  Adult onset diabetes mellitus #7.  Osteoarthritis  Plan; Long discussion with the patient his and his son today regarding risks and benefits of colonoscopy at his age, and general recommendation for discontinuing surveillance colonoscopies somewhere between age 42 and 74. We will  not plan further surveillance colonoscopies for this patient. Of course happy to see him at any point in the future for any problems.   Rae Sutcliffe S Avyn Coate PA-C 10/04/2023   Cc: Chilton Greathouse, MD

## 2023-10-04 NOTE — Patient Instructions (Signed)
Please follow up as needed.  _______________________________________________________  If your blood pressure at your visit was 140/90 or greater, please contact your primary care physician to follow up on this.  _______________________________________________________  If you are age 78 or older, your body mass index should be between 23-30. Your Body mass index is 31.59 kg/m. If this is out of the aforementioned range listed, please consider follow up with your Primary Care Provider.  If you are age 75 or younger, your body mass index should be between 19-25. Your Body mass index is 31.59 kg/m. If this is out of the aformentioned range listed, please consider follow up with your Primary Care Provider.   ________________________________________________________  The [place] [facility] providers would like to encourage you to use [facility] to communicate with providers for non-urgent requests or questions.  Due to long hold times on the telephone, sending your provider a message by [facility] may be a faster and more efficient way to get a response.  Please allow 48 business hours for a response.  Please remember that this is for non-urgent requests.  _______________________________________________________

## 2023-10-05 DIAGNOSIS — L814 Other melanin hyperpigmentation: Secondary | ICD-10-CM | POA: Diagnosis not present

## 2023-10-05 DIAGNOSIS — L821 Other seborrheic keratosis: Secondary | ICD-10-CM | POA: Diagnosis not present

## 2023-10-05 DIAGNOSIS — D1801 Hemangioma of skin and subcutaneous tissue: Secondary | ICD-10-CM | POA: Diagnosis not present

## 2023-10-05 DIAGNOSIS — D225 Melanocytic nevi of trunk: Secondary | ICD-10-CM | POA: Diagnosis not present

## 2023-12-24 DIAGNOSIS — E1151 Type 2 diabetes mellitus with diabetic peripheral angiopathy without gangrene: Secondary | ICD-10-CM | POA: Diagnosis not present

## 2023-12-24 DIAGNOSIS — K279 Peptic ulcer, site unspecified, unspecified as acute or chronic, without hemorrhage or perforation: Secondary | ICD-10-CM | POA: Diagnosis not present

## 2023-12-24 DIAGNOSIS — E785 Hyperlipidemia, unspecified: Secondary | ICD-10-CM | POA: Diagnosis not present

## 2023-12-24 DIAGNOSIS — R2681 Unsteadiness on feet: Secondary | ICD-10-CM | POA: Diagnosis not present

## 2023-12-24 DIAGNOSIS — I10 Essential (primary) hypertension: Secondary | ICD-10-CM | POA: Diagnosis not present

## 2023-12-24 DIAGNOSIS — J449 Chronic obstructive pulmonary disease, unspecified: Secondary | ICD-10-CM | POA: Diagnosis not present

## 2023-12-24 DIAGNOSIS — M179 Osteoarthritis of knee, unspecified: Secondary | ICD-10-CM | POA: Diagnosis not present

## 2023-12-24 DIAGNOSIS — Z8679 Personal history of other diseases of the circulatory system: Secondary | ICD-10-CM | POA: Diagnosis not present

## 2023-12-24 DIAGNOSIS — D126 Benign neoplasm of colon, unspecified: Secondary | ICD-10-CM | POA: Diagnosis not present

## 2023-12-24 DIAGNOSIS — Z23 Encounter for immunization: Secondary | ICD-10-CM | POA: Diagnosis not present

## 2023-12-25 ENCOUNTER — Emergency Department (HOSPITAL_COMMUNITY): Payer: Medicare Other

## 2023-12-25 ENCOUNTER — Other Ambulatory Visit: Payer: Self-pay

## 2023-12-25 ENCOUNTER — Emergency Department (HOSPITAL_COMMUNITY)
Admission: EM | Admit: 2023-12-25 | Discharge: 2023-12-26 | Discharge status: Against Medical Advice | Payer: Medicare Other | Attending: Emergency Medicine | Admitting: Emergency Medicine

## 2023-12-25 ENCOUNTER — Encounter (HOSPITAL_COMMUNITY): Payer: Self-pay | Admitting: Emergency Medicine

## 2023-12-25 DIAGNOSIS — S0990XA Unspecified injury of head, initial encounter: Secondary | ICD-10-CM | POA: Diagnosis not present

## 2023-12-25 DIAGNOSIS — R200 Anesthesia of skin: Secondary | ICD-10-CM | POA: Diagnosis not present

## 2023-12-25 DIAGNOSIS — R202 Paresthesia of skin: Secondary | ICD-10-CM | POA: Insufficient documentation

## 2023-12-25 DIAGNOSIS — W1830XA Fall on same level, unspecified, initial encounter: Secondary | ICD-10-CM | POA: Diagnosis not present

## 2023-12-25 DIAGNOSIS — R42 Dizziness and giddiness: Secondary | ICD-10-CM | POA: Diagnosis not present

## 2023-12-25 DIAGNOSIS — Z5321 Procedure and treatment not carried out due to patient leaving prior to being seen by health care provider: Secondary | ICD-10-CM | POA: Insufficient documentation

## 2023-12-25 DIAGNOSIS — S0093XA Contusion of unspecified part of head, initial encounter: Secondary | ICD-10-CM | POA: Insufficient documentation

## 2023-12-25 LAB — COMPREHENSIVE METABOLIC PANEL
ALT: 11 U/L (ref 0–44)
AST: 16 U/L (ref 15–41)
Albumin: 4 g/dL (ref 3.5–5.0)
Alkaline Phosphatase: 64 U/L (ref 38–126)
Anion gap: 9 (ref 5–15)
BUN: 15 mg/dL (ref 8–23)
CO2: 28 mmol/L (ref 22–32)
Calcium: 9.6 mg/dL (ref 8.9–10.3)
Chloride: 103 mmol/L (ref 98–111)
Creatinine, Ser: 0.9 mg/dL (ref 0.61–1.24)
GFR, Estimated: >60 mL/min (ref >60)
Glucose, Bld: 144 mg/dL — ABNORMAL HIGH (ref 70–99)
Potassium: 4.2 mmol/L (ref 3.5–5.1)
Sodium: 140 mmol/L (ref 135–145)
Total Bilirubin: 1.3 mg/dL — ABNORMAL HIGH (ref 0.0–1.2)
Total Protein: 6.7 g/dL (ref 6.5–8.1)

## 2023-12-25 LAB — CBC WITH DIFFERENTIAL/PLATELET
Abs Immature Granulocytes: 0.03 10*3/uL (ref 0.00–0.07)
Basophils Absolute: 0.1 10*3/uL (ref 0.0–0.1)
Basophils Relative: 1 %
Eosinophils Absolute: 0.1 10*3/uL (ref 0.0–0.5)
Eosinophils Relative: 1 %
HCT: 43.5 % (ref 39.0–52.0)
Hemoglobin: 14.1 g/dL (ref 13.0–17.0)
Immature Granulocytes: 0 %
Lymphocytes Relative: 33 %
Lymphs Abs: 2.5 10*3/uL (ref 0.7–4.0)
MCH: 29.8 pg (ref 26.0–34.0)
MCHC: 32.4 g/dL (ref 30.0–36.0)
MCV: 92 fL (ref 80.0–100.0)
Monocytes Absolute: 0.7 10*3/uL (ref 0.1–1.0)
Monocytes Relative: 9 %
Neutro Abs: 4.4 10*3/uL (ref 1.7–7.7)
Neutrophils Relative %: 56 %
Platelets: 210 10*3/uL (ref 150–400)
RBC: 4.73 MIL/uL (ref 4.22–5.81)
RDW: 14.1 % (ref 11.5–15.5)
WBC: 7.7 10*3/uL (ref 4.0–10.5)
nRBC: 0 % (ref 0.0–0.2)

## 2023-12-25 NOTE — ED Provider Triage Note (Signed)
 Emergency Medicine Provider Triage Evaluation Note  Trevor Bird , a 78 y.o. male  was evaluated in triage.  Pt complains of intermittent dizziness for past 6 mo. Leaning over arm of recliner then started feeling numbness on right side of lower arm and three fingers that started at 1500 that last 20 minutes. Then started feeling middle of lip numb for minutes.  Drive truck to sisters house and walked into her house for help  [place] on floor a 3 weeks ago. Hematoma on left side of head  Review of Systems  Positive: numbness Negative: fevers  Physical Exam  BP 138/77   Pulse 62   Temp 97.8 F (36.6 C) (Oral)   Resp 18   Ht 5' 9 (1.753 m)   Wt 98.4 kg   SpO2 97%   BMI 32.05 kg/m  Gen:   Awake, no distress   Resp:  Normal effort  MSK:   Moves extremities without difficulty  Other:    Medical Decision Making  Medically screening exam initiated at 8:08 PM.  Appropriate orders placed.  EMET RAFANAN was informed that the remainder of the evaluation will be completed by another provider, this initial triage assessment does not replace that evaluation, and the importance of remaining in the ED until their evaluation is complete.  Labs and ct ordered   Trevor Bird, Trevor Bird 12/25/23 2016

## 2023-12-25 NOTE — ED Triage Notes (Signed)
 Patient complaining of intermittent dizziness ongoing for 6 months. Patient also reports right arm tingling and right lip tingling for approx 30 minutes at 3pm. Reports fall 3 weeks ago with head trauma. Denies blood thinners.

## 2023-12-26 NOTE — ED Notes (Signed)
 Patient left.

## 2024-03-24 DIAGNOSIS — I10 Essential (primary) hypertension: Secondary | ICD-10-CM | POA: Diagnosis not present

## 2024-03-24 DIAGNOSIS — Z125 Encounter for screening for malignant neoplasm of prostate: Secondary | ICD-10-CM | POA: Diagnosis not present

## 2024-03-24 DIAGNOSIS — Z1212 Encounter for screening for malignant neoplasm of rectum: Secondary | ICD-10-CM | POA: Diagnosis not present

## 2024-03-24 DIAGNOSIS — E1151 Type 2 diabetes mellitus with diabetic peripheral angiopathy without gangrene: Secondary | ICD-10-CM | POA: Diagnosis not present

## 2024-03-24 DIAGNOSIS — R82998 Other abnormal findings in urine: Secondary | ICD-10-CM | POA: Diagnosis not present

## 2024-03-24 DIAGNOSIS — E785 Hyperlipidemia, unspecified: Secondary | ICD-10-CM | POA: Diagnosis not present

## 2024-03-31 DIAGNOSIS — E1151 Type 2 diabetes mellitus with diabetic peripheral angiopathy without gangrene: Secondary | ICD-10-CM | POA: Diagnosis not present

## 2024-03-31 DIAGNOSIS — Z1339 Encounter for screening examination for other mental health and behavioral disorders: Secondary | ICD-10-CM | POA: Diagnosis not present

## 2024-03-31 DIAGNOSIS — R2681 Unsteadiness on feet: Secondary | ICD-10-CM | POA: Diagnosis not present

## 2024-03-31 DIAGNOSIS — Z1331 Encounter for screening for depression: Secondary | ICD-10-CM | POA: Diagnosis not present

## 2024-03-31 DIAGNOSIS — M179 Osteoarthritis of knee, unspecified: Secondary | ICD-10-CM | POA: Diagnosis not present

## 2024-03-31 DIAGNOSIS — I10 Essential (primary) hypertension: Secondary | ICD-10-CM | POA: Diagnosis not present

## 2024-03-31 DIAGNOSIS — Z0000 Encounter for general adult medical examination without abnormal findings: Secondary | ICD-10-CM | POA: Diagnosis not present

## 2024-03-31 DIAGNOSIS — D126 Benign neoplasm of colon, unspecified: Secondary | ICD-10-CM | POA: Diagnosis not present

## 2024-03-31 DIAGNOSIS — E785 Hyperlipidemia, unspecified: Secondary | ICD-10-CM | POA: Diagnosis not present

## 2024-03-31 DIAGNOSIS — Z8679 Personal history of other diseases of the circulatory system: Secondary | ICD-10-CM | POA: Diagnosis not present

## 2024-03-31 DIAGNOSIS — J449 Chronic obstructive pulmonary disease, unspecified: Secondary | ICD-10-CM | POA: Diagnosis not present

## 2024-03-31 DIAGNOSIS — K279 Peptic ulcer, site unspecified, unspecified as acute or chronic, without hemorrhage or perforation: Secondary | ICD-10-CM | POA: Diagnosis not present

## 2024-08-13 DIAGNOSIS — M25531 Pain in right wrist: Secondary | ICD-10-CM | POA: Diagnosis not present

## 2024-08-21 DIAGNOSIS — K279 Peptic ulcer, site unspecified, unspecified as acute or chronic, without hemorrhage or perforation: Secondary | ICD-10-CM | POA: Diagnosis not present

## 2024-08-21 DIAGNOSIS — R2681 Unsteadiness on feet: Secondary | ICD-10-CM | POA: Diagnosis not present

## 2024-08-21 DIAGNOSIS — I10 Essential (primary) hypertension: Secondary | ICD-10-CM | POA: Diagnosis not present

## 2024-08-21 DIAGNOSIS — E785 Hyperlipidemia, unspecified: Secondary | ICD-10-CM | POA: Diagnosis not present

## 2024-08-21 DIAGNOSIS — M179 Osteoarthritis of knee, unspecified: Secondary | ICD-10-CM | POA: Diagnosis not present

## 2024-08-21 DIAGNOSIS — Z8679 Personal history of other diseases of the circulatory system: Secondary | ICD-10-CM | POA: Diagnosis not present

## 2024-08-21 DIAGNOSIS — E1151 Type 2 diabetes mellitus with diabetic peripheral angiopathy without gangrene: Secondary | ICD-10-CM | POA: Diagnosis not present

## 2024-08-21 DIAGNOSIS — D126 Benign neoplasm of colon, unspecified: Secondary | ICD-10-CM | POA: Diagnosis not present

## 2024-08-21 DIAGNOSIS — J449 Chronic obstructive pulmonary disease, unspecified: Secondary | ICD-10-CM | POA: Diagnosis not present
